# Patient Record
Sex: Female | Born: 1975 | Race: White | Hispanic: No | Marital: Married | Smoking: Former smoker
Health system: Southern US, Community
[De-identification: ages and names within clinical notes are randomized; demographics above are authoritative.]

## PROBLEM LIST (undated history)

## (undated) DIAGNOSIS — G514 Facial myokymia: Secondary | ICD-10-CM

## (undated) DIAGNOSIS — M2241 Chondromalacia patellae, right knee: Secondary | ICD-10-CM

## (undated) DIAGNOSIS — S83519A Sprain of anterior cruciate ligament of unspecified knee, initial encounter: Secondary | ICD-10-CM

## (undated) DIAGNOSIS — Z98811 Dental restoration status: Secondary | ICD-10-CM

## (undated) HISTORY — DX: Facial myokymia: G51.4

---

## 1999-09-20 ENCOUNTER — Other Ambulatory Visit: Admission: RE | Admit: 1999-09-20 | Discharge: 1999-09-20 | Payer: Self-pay | Admitting: *Deleted

## 2000-06-11 HISTORY — PX: ANTERIOR CRUCIATE LIGAMENT REPAIR: SHX115

## 2000-08-12 ENCOUNTER — Other Ambulatory Visit: Admission: RE | Admit: 2000-08-12 | Discharge: 2000-08-12 | Payer: Self-pay | Admitting: *Deleted

## 2001-09-08 ENCOUNTER — Encounter: Payer: Self-pay | Admitting: *Deleted

## 2001-09-08 ENCOUNTER — Ambulatory Visit (HOSPITAL_COMMUNITY): Admission: RE | Admit: 2001-09-08 | Discharge: 2001-09-08 | Payer: Self-pay | Admitting: *Deleted

## 2001-10-06 ENCOUNTER — Other Ambulatory Visit: Admission: RE | Admit: 2001-10-06 | Discharge: 2001-10-06 | Payer: Self-pay | Admitting: Obstetrics and Gynecology

## 2002-04-01 ENCOUNTER — Inpatient Hospital Stay (HOSPITAL_COMMUNITY): Admission: AD | Admit: 2002-04-01 | Discharge: 2002-04-03 | Payer: Self-pay | Admitting: Obstetrics and Gynecology

## 2002-06-18 ENCOUNTER — Other Ambulatory Visit: Admission: RE | Admit: 2002-06-18 | Discharge: 2002-06-18 | Payer: Self-pay | Admitting: Obstetrics and Gynecology

## 2003-08-25 ENCOUNTER — Other Ambulatory Visit: Admission: RE | Admit: 2003-08-25 | Discharge: 2003-08-25 | Payer: Self-pay | Admitting: Obstetrics and Gynecology

## 2004-02-18 ENCOUNTER — Inpatient Hospital Stay (HOSPITAL_COMMUNITY): Admission: AD | Admit: 2004-02-18 | Discharge: 2004-02-21 | Payer: Self-pay | Admitting: Obstetrics & Gynecology

## 2004-02-22 ENCOUNTER — Inpatient Hospital Stay (HOSPITAL_COMMUNITY): Admission: AD | Admit: 2004-02-22 | Discharge: 2004-02-24 | Payer: Self-pay | Admitting: Obstetrics and Gynecology

## 2004-04-20 ENCOUNTER — Other Ambulatory Visit: Admission: RE | Admit: 2004-04-20 | Discharge: 2004-04-20 | Payer: Self-pay | Admitting: Obstetrics and Gynecology

## 2004-10-09 ENCOUNTER — Other Ambulatory Visit: Admission: RE | Admit: 2004-10-09 | Discharge: 2004-10-09 | Payer: Self-pay | Admitting: Obstetrics and Gynecology

## 2005-03-09 ENCOUNTER — Other Ambulatory Visit: Admission: RE | Admit: 2005-03-09 | Discharge: 2005-03-09 | Payer: Self-pay | Admitting: Obstetrics and Gynecology

## 2013-01-02 ENCOUNTER — Other Ambulatory Visit: Payer: Self-pay | Admitting: Orthopaedic Surgery

## 2013-01-09 DIAGNOSIS — M2241 Chondromalacia patellae, right knee: Secondary | ICD-10-CM

## 2013-01-09 DIAGNOSIS — S83519A Sprain of anterior cruciate ligament of unspecified knee, initial encounter: Secondary | ICD-10-CM

## 2013-01-09 HISTORY — DX: Chondromalacia patellae, right knee: M22.41

## 2013-01-09 HISTORY — DX: Sprain of anterior cruciate ligament of unspecified knee, initial encounter: S83.519A

## 2013-01-13 ENCOUNTER — Encounter (HOSPITAL_BASED_OUTPATIENT_CLINIC_OR_DEPARTMENT_OTHER): Payer: Self-pay | Admitting: *Deleted

## 2013-01-18 NOTE — H&P (Signed)
Gabriela Johnson is an 37 y.o. female.   Chief Complaint: Right knee pain HPI: Patient is complaining of right knee pain and instability.  She thought she was going to step on a snake and twisted her knee and fell.  This happened a few weeks ago.  Since that time she has had increasing pain in her knee and instability.  A recent MRI scan dated December 02, 2012 shows a complete ACL tear.  She has had the left knee ACL reconstructed many years ago.  We have discussed proceeding with ACL reconstruction on the right knee to increase stability.  Past Medical History  Diagnosis Date  . ACL tear 01/2013    right knee  . 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 Dental crowns present     also dental caps    Past Surgical History  Procedure Laterality Date  . Anterior cruciate ligament repair Left 2002  . Cesarean section      History reviewed. No pertinent family history. Social History:  reports that she has never smoked. She has never used smokeless tobacco. She reports that  drinks alcohol. She reports that she does not use illicit drugs.  Allergies: No Known Allergies  No prescriptions prior to admission    No results found for this or any previous visit (from the past 48 hour(s)). No results found.  Review of Systems  Musculoskeletal: Positive for back pain.  All other systems reviewed and are negative.    Height 5\' 3"  (1.6 m), weight 56.7 kg (125 lb), last menstrual period 12/31/2012. Physical Exam  Constitutional: She is oriented to person, place, and time. She appears well-developed.  HENT:  Head: Normocephalic.  Eyes: Pupils are equal, round, and reactive to light.  Neck: Normal range of motion.  Cardiovascular: Normal rate.   Respiratory: Effort normal.  GI: Soft.  Musculoskeletal:  Right knee exam: Trace effusion.  Some pain along the medial joint line to palpation.  Lockman test is loose.  Stability medial and lateral.  Drawer test positive as well.   Neurological: She is oriented to person, place, and time.  Skin: Skin is warm.     Assessment/Plan Assessment: Right knee ACL tear. Plan: We have discussed proceeding with right knee ACL reconstruction to improve stability and function.  We have discussed the risks of anesthesia, infection and DVT associated with this procedure.  We have also stressed the rehabilitation process necessary as well.  Oline Belk R 01/18/2013, 1:27 PM

## 2013-01-20 ENCOUNTER — Encounter (HOSPITAL_BASED_OUTPATIENT_CLINIC_OR_DEPARTMENT_OTHER)
Admission: RE | Disposition: A | Discharge status: Home / Self Care | Payer: Self-pay | Source: Ambulatory Visit | Attending: Orthopaedic Surgery

## 2013-01-20 ENCOUNTER — Ambulatory Visit (HOSPITAL_BASED_OUTPATIENT_CLINIC_OR_DEPARTMENT_OTHER): Payer: BC Managed Care – PPO | Admitting: *Deleted

## 2013-01-20 ENCOUNTER — Encounter (HOSPITAL_BASED_OUTPATIENT_CLINIC_OR_DEPARTMENT_OTHER): Payer: Self-pay | Admitting: *Deleted

## 2013-01-20 ENCOUNTER — Ambulatory Visit (HOSPITAL_BASED_OUTPATIENT_CLINIC_OR_DEPARTMENT_OTHER)
Admission: RE | Admit: 2013-01-20 | Discharge: 2013-01-20 | Disposition: A | Discharge status: Home / Self Care | Payer: BC Managed Care – PPO | Source: Ambulatory Visit | Attending: Orthopaedic Surgery | Admitting: Orthopaedic Surgery

## 2013-01-20 DIAGNOSIS — Y929 Unspecified place or not applicable: Secondary | ICD-10-CM | POA: Insufficient documentation

## 2013-01-20 DIAGNOSIS — W010XXA Fall on same level from slipping, tripping and stumbling without subsequent striking against object, initial encounter: Secondary | ICD-10-CM | POA: Insufficient documentation

## 2013-01-20 DIAGNOSIS — M2240 Chondromalacia patellae, unspecified knee: Secondary | ICD-10-CM | POA: Insufficient documentation

## 2013-01-20 DIAGNOSIS — S83509A Sprain of unspecified cruciate ligament of unspecified knee, initial encounter: Secondary | ICD-10-CM | POA: Insufficient documentation

## 2013-01-20 DIAGNOSIS — S83511A Sprain of anterior cruciate ligament of right knee, initial encounter: Secondary | ICD-10-CM

## 2013-01-20 HISTORY — PX: CHONDROPLASTY: SHX5177

## 2013-01-20 HISTORY — DX: Dental restoration status: Z98.811

## 2013-01-20 HISTORY — PX: KNEE ARTHROSCOPY WITH ANTERIOR CRUCIATE LIGAMENT (ACL) REPAIR WITH HAMSTRING GRAFT: SHX5645

## 2013-01-20 HISTORY — DX: Sprain of anterior cruciate ligament of unspecified knee, initial encounter: S83.519A

## 2013-01-20 HISTORY — DX: Chondromalacia patellae, right knee: M22.41

## 2013-01-20 SURGERY — KNEE ARTHROSCOPY WITH ANTERIOR CRUCIATE LIGAMENT (ACL) REPAIR WITH HAMSTRING GRAFT
Anesthesia: General | Site: Knee | Laterality: Right | Wound class: Clean

## 2013-01-20 MED ORDER — OXYCODONE HCL 5 MG/5ML PO SOLN: 5.0000 mg | Freq: Once | ORAL | Status: DC | PRN

## 2013-01-20 MED ORDER — CEFAZOLIN SODIUM-DEXTROSE 2-3 GM-% IV SOLR
2.0000 g | INTRAVENOUS | Status: AC
  Administered 2013-01-20: 2 g via INTRAVENOUS

## 2013-01-20 MED ORDER — ONDANSETRON HCL 4 MG/2ML IJ SOLN
INTRAMUSCULAR | Status: DC | PRN
  Administered 2013-01-20: 4 mg via INTRAVENOUS

## 2013-01-20 MED ORDER — MIDAZOLAM HCL 5 MG/5ML IJ SOLN
INTRAMUSCULAR | Status: DC | PRN
  Administered 2013-01-20: 2 mg via INTRAVENOUS

## 2013-01-20 MED ORDER — FENTANYL CITRATE 0.05 MG/ML IJ SOLN
INTRAMUSCULAR | Status: DC | PRN
  Administered 2013-01-20 (×4): 25 ug via INTRAVENOUS

## 2013-01-20 MED ORDER — MIDAZOLAM HCL 2 MG/2ML IJ SOLN: 1.0000 mg | INTRAMUSCULAR | Status: DC | PRN

## 2013-01-20 MED ORDER — DEXAMETHASONE SODIUM PHOSPHATE 4 MG/ML IJ SOLN
INTRAMUSCULAR | Status: DC | PRN
  Administered 2013-01-20: 10 mg via INTRAVENOUS

## 2013-01-20 MED ORDER — ONDANSETRON HCL 4 MG/2ML IJ SOLN: 4.0000 mg | Freq: Four times a day (QID) | INTRAMUSCULAR | Status: DC | PRN

## 2013-01-20 MED ORDER — MIDAZOLAM HCL 2 MG/ML PO SYRP: 12.0000 mg | ORAL_SOLUTION | Freq: Once | ORAL | Status: DC | PRN

## 2013-01-20 MED ORDER — MIDAZOLAM HCL 2 MG/2ML IJ SOLN
1.0000 mg | INTRAMUSCULAR | Status: DC | PRN
  Administered 2013-01-20: 2 mg via INTRAVENOUS

## 2013-01-20 MED ORDER — BUPIVACAINE-EPINEPHRINE PF 0.5-1:200000 % IJ SOLN
INTRAMUSCULAR | Status: DC | PRN
  Administered 2013-01-20: 30 mL

## 2013-01-20 MED ORDER — CHLORHEXIDINE GLUCONATE 4 % EX LIQD: 60.0000 mL | Freq: Once | CUTANEOUS | Status: DC

## 2013-01-20 MED ORDER — LACTATED RINGERS IV SOLN: INTRAVENOUS | Status: DC

## 2013-01-20 MED ORDER — LIDOCAINE HCL (CARDIAC) 20 MG/ML IV SOLN
INTRAVENOUS | Status: DC | PRN
  Administered 2013-01-20: 80 mg via INTRAVENOUS

## 2013-01-20 MED ORDER — FENTANYL CITRATE 0.05 MG/ML IJ SOLN
50.0000 ug | INTRAMUSCULAR | Status: DC | PRN
  Administered 2013-01-20: 100 ug via INTRAVENOUS

## 2013-01-20 MED ORDER — SODIUM CHLORIDE 0.9 % IR SOLN
Status: DC | PRN
  Administered 2013-01-20: 24000 mL

## 2013-01-20 MED ORDER — FENTANYL CITRATE 0.05 MG/ML IJ SOLN: 50.0000 ug | INTRAMUSCULAR | Status: DC | PRN

## 2013-01-20 MED ORDER — HYDROMORPHONE HCL PF 1 MG/ML IJ SOLN
0.2500 mg | INTRAMUSCULAR | Status: DC | PRN
  Administered 2013-01-20 (×3): 0.5 mg via INTRAVENOUS

## 2013-01-20 MED ORDER — OXYCODONE HCL 5 MG PO TABS: 5.0000 mg | ORAL_TABLET | Freq: Once | ORAL | Status: DC | PRN

## 2013-01-20 MED ORDER — OXYCODONE-ACETAMINOPHEN 5-325 MG PO TABS: 2.0000 | ORAL_TABLET | ORAL | Status: DC | PRN

## 2013-01-20 MED ORDER — LACTATED RINGERS IV SOLN
INTRAVENOUS | Status: DC
  Administered 2013-01-20 (×2): via INTRAVENOUS

## 2013-01-20 MED ORDER — PROPOFOL 10 MG/ML IV BOLUS
INTRAVENOUS | Status: DC | PRN
  Administered 2013-01-20: 40 mg via INTRAVENOUS
  Administered 2013-01-20: 160 mg via INTRAVENOUS

## 2013-01-20 SURGICAL SUPPLY — 74 items
APL SKNCLS STERI-STRIP NONHPOA (GAUZE/BANDAGES/DRESSINGS)
BANDAGE ELASTIC 6 VELCRO ST LF (GAUZE/BANDAGES/DRESSINGS) ×2 IMPLANT
BANDAGE ESMARK 6X9 LF (GAUZE/BANDAGES/DRESSINGS) IMPLANT
BENZOIN TINCTURE PRP APPL 2/3 (GAUZE/BANDAGES/DRESSINGS) IMPLANT
BLADE CUDA 5.5 (BLADE) IMPLANT
BLADE GREAT WHITE 4.2 (BLADE) ×2 IMPLANT
BLADE SURG 15 STRL LF DISP TIS (BLADE) ×1 IMPLANT
BLADE SURG 15 STRL SS (BLADE) ×2
BNDG CMPR 9X6 STRL LF SNTH (GAUZE/BANDAGES/DRESSINGS)
BNDG ESMARK 6X9 LF (GAUZE/BANDAGES/DRESSINGS)
BUR VERTEX HOODED 4.5 (BURR) ×2 IMPLANT
CANISTER OMNI JUG 16 LITER (MISCELLANEOUS) ×2 IMPLANT
CANISTER SUCTION 2500CC (MISCELLANEOUS) IMPLANT
COVER TABLE BACK 60X90 (DRAPES) ×2 IMPLANT
CUFF TOURNIQUET SINGLE 34IN LL (TOURNIQUET CUFF) ×2 IMPLANT
DECANTER SPIKE VIAL GLASS SM (MISCELLANEOUS) IMPLANT
DRAPE ARTHROSCOPY W/POUCH 114 (DRAPES) ×2 IMPLANT
DRAPE INCISE IOBAN 66X45 STRL (DRAPES) IMPLANT
DRAPE U-SHAPE 47X51 STRL (DRAPES) ×2 IMPLANT
DRSG EMULSION OIL 3X3 NADH (GAUZE/BANDAGES/DRESSINGS) ×2 IMPLANT
DURAPREP 26ML APPLICATOR (WOUND CARE) ×2 IMPLANT
ELECT MENISCUS 165MM 90D (ELECTRODE) IMPLANT
ELECT REM PT RETURN 9FT ADLT (ELECTROSURGICAL) ×2
ELECTRODE REM PT RTRN 9FT ADLT (ELECTROSURGICAL) ×1 IMPLANT
FIBERSTICK 2 (SUTURE) IMPLANT
GAUZE SPONGE 4X4 16PLY XRAY LF (GAUZE/BANDAGES/DRESSINGS) IMPLANT
GLOVE BIO SURGEON STRL SZ 6.5 (GLOVE) ×2 IMPLANT
GLOVE BIO SURGEON STRL SZ8.5 (GLOVE) ×2 IMPLANT
GLOVE BIOGEL PI IND STRL 7.0 (GLOVE) ×1 IMPLANT
GLOVE BIOGEL PI IND STRL 8 (GLOVE) ×1 IMPLANT
GLOVE BIOGEL PI IND STRL 8.5 (GLOVE) ×1 IMPLANT
GLOVE BIOGEL PI INDICATOR 7.0 (GLOVE) ×1
GLOVE BIOGEL PI INDICATOR 8 (GLOVE) ×1
GLOVE BIOGEL PI INDICATOR 8.5 (GLOVE) ×1
GLOVE SS BIOGEL STRL SZ 8 (GLOVE) ×1 IMPLANT
GLOVE SUPERSENSE BIOGEL SZ 8 (GLOVE) ×1
GOWN PREVENTION PLUS XLARGE (GOWN DISPOSABLE) ×2 IMPLANT
GOWN PREVENTION PLUS XXLARGE (GOWN DISPOSABLE) ×2 IMPLANT
IMMOBILIZER KNEE 22 UNIV (SOFTGOODS) ×2 IMPLANT
IMMOBILIZER KNEE 24 THIGH 36 (MISCELLANEOUS) IMPLANT
IMMOBILIZER KNEE 24 UNIV (MISCELLANEOUS)
IV NS IRRIG 3000ML ARTHROMATIC (IV SOLUTION) ×18 IMPLANT
KIT TRANSTIBIAL (DISPOSABLE) IMPLANT
KNEE WRAP E Z 3 GEL PACK (MISCELLANEOUS) ×2 IMPLANT
NS IRRIG 1000ML POUR BTL (IV SOLUTION) ×2 IMPLANT
PACK ARTHROSCOPY DSU (CUSTOM PROCEDURE TRAY) ×2 IMPLANT
PACK BASIN DAY SURGERY FS (CUSTOM PROCEDURE TRAY) ×2 IMPLANT
PATELLA LIGAMENT BISECTED FR (Tissue) ×2 IMPLANT
PENCIL BUTTON HOLSTER BLD 10FT (ELECTRODE) IMPLANT
SCREW PROPEL 8X20 (Screw) ×4 IMPLANT
SET ARTHROSCOPY TUBING (MISCELLANEOUS) ×2
SET ARTHROSCOPY TUBING LN (MISCELLANEOUS) ×1 IMPLANT
SHEET MEDIUM DRAPE 40X70 STRL (DRAPES) ×2 IMPLANT
SLEEVE SCD COMPRESS KNEE MED (MISCELLANEOUS) ×2 IMPLANT
SPONGE GAUZE 4X4 12PLY (GAUZE/BANDAGES/DRESSINGS) ×2 IMPLANT
SPONGE LAP 4X18 X RAY DECT (DISPOSABLE) IMPLANT
STRIP CLOSURE SKIN 1/2X4 (GAUZE/BANDAGES/DRESSINGS) IMPLANT
SUCTION FRAZIER TIP 10 FR DISP (SUCTIONS) IMPLANT
SUT 2 FIBERLOOP 20 STRT BLUE (SUTURE) ×4
SUT ETHILON 4 0 PS 2 18 (SUTURE) IMPLANT
SUT PDS AB 1 CT  36 (SUTURE)
SUT PDS AB 1 CT 36 (SUTURE) IMPLANT
SUT STEEL 5 (SUTURE) ×2 IMPLANT
SUT VIC AB 0 CT1 27 (SUTURE)
SUT VIC AB 0 CT1 27XBRD ANBCTR (SUTURE) IMPLANT
SUT VIC AB 2-0 SH 27 (SUTURE) ×4
SUT VIC AB 2-0 SH 27XBRD (SUTURE) ×2 IMPLANT
SUT VIC AB 3-0 FS2 27 (SUTURE) IMPLANT
SUTURE 2 FIBERLOOP 20 STRT BLU (SUTURE) ×2 IMPLANT
SYR 3ML 18GX1 1/2 (SYRINGE) IMPLANT
TOWEL OR 17X24 6PK STRL BLUE (TOWEL DISPOSABLE) ×2 IMPLANT
TOWEL OR NON WOVEN STRL DISP B (DISPOSABLE) ×2 IMPLANT
WAND 30 DEG SABER W/CORD (SURGICAL WAND) IMPLANT
WATER STERILE IRR 1000ML POUR (IV SOLUTION) ×2 IMPLANT

## 2013-01-20 NOTE — Transfer of Care (Signed)
Immediate Anesthesia Transfer of Care Note  Patient: Gabriela Johnson  Procedure(s) Performed: Procedure(s): RIGHT KNEE ARTHROSCOPY WITH ANTERIOR CRUCIATE LIGAMENT (ACL) RECONSTRUCTION WITH ALLOGRAFT FROM PATELLA TENDON (Right) CHONDROPLASTY RIGHT KNEE (Right)  Patient Location: PACU  Anesthesia Type:GA combined with regional for post-op pain  Level of Consciousness: awake, sedated and patient cooperative  Airway & Oxygen Therapy: Patient Spontanous Breathing and Patient connected to face mask oxygen  Post-op Assessment: Report given to PACU RN and Post -op Vital signs reviewed and stable  Post vital signs: Reviewed and stable  Complications: No apparent anesthesia complications

## 2013-01-20 NOTE — Anesthesia Preprocedure Evaluation (Signed)
Anesthesia Evaluation  Patient identified by MRN, date of birth, ID band Patient awake    Reviewed: Allergy & Precautions, H&P , NPO status , Patient's Chart, lab work & pertinent test results  Airway Mallampati: II  Neck ROM: full    Dental   Pulmonary          Cardiovascular     Neuro/Psych    GI/Hepatic   Endo/Other    Renal/GU      Musculoskeletal   Abdominal   Peds  Hematology   Anesthesia Other Findings   Reproductive/Obstetrics                           Anesthesia Physical Anesthesia Plan  ASA: I  Anesthesia Plan: General and Regional   Post-op Pain Management: MAC Combined w/ Regional for Post-op pain   Induction: Intravenous  Airway Management Planned: LMA  Additional Equipment:   Intra-op Plan:   Post-operative Plan:   Informed Consent: I have reviewed the patients History and Physical, chart, labs and discussed the procedure including the risks, benefits and alternatives for the proposed anesthesia with the patient or authorized representative who has indicated his/her understanding and acceptance.     Plan Discussed with: CRNA, Anesthesiologist and Surgeon  Anesthesia Plan Comments:         Anesthesia Quick Evaluation

## 2013-01-20 NOTE — Anesthesia Procedure Notes (Addendum)
Procedure Name: LMA Insertion Date/Time: 01/20/2013 1:01 PM Performed by: Gar Gibbon Pre-anesthesia Checklist: Patient identified, Emergency Drugs available, Suction available and Patient being monitored Patient Re-evaluated:Patient Re-evaluated prior to inductionOxygen Delivery Method: Circle System Utilized Preoxygenation: Pre-oxygenation with 100% oxygen Intubation Type: IV induction Ventilation: Mask ventilation without difficulty LMA: LMA inserted LMA Size: 4.0 Number of attempts: 1 Airway Equipment and Method: bite block Placement Confirmation: positive ETCO2 Tube secured with: Tape Dental Injury: Teeth and Oropharynx as per pre-operative assessment    Anesthesia Regional Block:  Femoral nerve block  Pre-Anesthetic Checklist: ,, timeout performed, Correct Patient, Correct Site, Correct Laterality, Correct Procedure,, site marked, risks and benefits discussed, Surgical consent,  Pre-op evaluation,  At surgeon's request and post-op pain management  Laterality: Right  Prep: chloraprep       Needles:  Injection technique: Single-shot  Needle Type: Echogenic Stimulator Needle     Needle Length: 9cm  Needle Gauge: 21    Additional Needles:  Procedures: nerve stimulator Femoral nerve block  Nerve Stimulator or Paresthesia:  Response: Quadriceps muscle contraction, 0.45 mA,   Additional Responses:   Narrative:  Start time: 01/20/2013 12:20 PM End time: 01/20/2013 1:30 PM Injection made incrementally with aspirations every 5 mL.  Performed by: Personally  Anesthesiologist: Dr Chaney Malling  Additional Notes: Functioning IV was confirmed and monitors were applied.  A 90mm 21ga Arrow echogenic stimulator needle was used. Sterile prep and drape,hand hygiene and sterile gloves were used.  Negative aspiration and negative test dose prior to incremental administration of local anesthetic. The patient tolerated the procedure well.    Femoral nerve block

## 2013-01-20 NOTE — Interval H&P Note (Signed)
History and Physical Interval Note:  01/20/2013 12:18 PM  Gabriela Johnson  has presented today for surgery, with the diagnosis of RIGHT KNEE ACL TEAR AND CHONDROMALACIA  The various methods of treatment have been discussed with the patient and family. After consideration of risks, benefits and other options for treatment, the patient has consented to  Procedure(s): RIGHT KNEE ARTHROSCOPY WITH ANTERIOR CRUCIATE LIGAMENT (ACL) RECONSTRUCTION WITH ALLOGRAFT FROM PATELLA TENDON (Right) as a surgical intervention .  The patient's history has been reviewed, patient examined, no change in status, stable for surgery.  I have reviewed the patient's chart and labs.  Questions were answered to the patient's satisfaction.     Denson Niccoli G

## 2013-01-20 NOTE — Op Note (Signed)
PRE-OP DIAGNOSIS:  ACL tear right knee and chondromalacia patella POST-OP DIAGNOSIS:  ACL tear right knee and chondromalacia patella  PROCEDURE:  ACL reconstruction  right knee  and chondroplasty patella SURGEON:  Marcene Corning MD ASSISTANT: Lindwood Qua PA ANESTHESIA:  General and block  INDICATION FOR PROCEDURE:  Gabriela Johnson is a 37 y.o. female with an unstable knee.  The patient has failed non-operative measures and has a knee that does not allow for participation in desired activities.  The patient is offered ACL reconstruction in hopes of stabilizing the knee.  Associated conditions are to be addressed as well.  Informed operative consent was obtained after discussion of risks including reaction to anesthesia, infection, DVT, and stiffness.  The importance of the post-operative rehabilitation protocol to optimize result was stressed extensively with the patient.  SUMMARY OF FINDINGS AND PROCEDURE:  Gabriela Johnson was taken to the operative suite where under the above anesthesia a knee arthroscopy and ACL reconstruction was performed. The suprapatellar pouch was benign while the patellofemoral joint showed a small area of grade III articular cartilage damage.  The medial compartment was notable for no articular cartilage damage and no meniscal pathology.  The ACL was torn and the PCL was intact.  The lateral compartment was notable for no articular cartilage damage and no meniscal pathology.  The meniscal and articular cartilage problems were addressed with chondroplasty patella. We used patellar tendon allograft material and stabilized at both ends with metal Linvatec screws.   Silvio Pate PA assisted throughout and was invaluable to the completion of the case in that he positioned and retracted and also fashioned the graft on the back table while I performed arthroscopic portions of the case thereby significantly minimizing OR time.  The patient was scheduled to stay overnight at  but might go home depending on condition in the recovery room.  DESCRIPTION OF PROCEDURE:  Gabriela Johnson was taken to the operative suite where the above anesthetic was applied.  The patient was positioned supine and prepped and draped in normal sterile fashion.  An appropriate time out was performed.  After the administration of kefzol pre-operative antibiotic and arthroscopy of the knee was performed. Findings were as noted above and appropriate articular and meniscal cartilage work was done.  The ACL reconstruction was then performed utilizing the above mentioned material.  We harvested the middle third of the patellar tendon through a longitudinal incision and dissection through peritenon.  A saw was used to create contiguous bone plugs from the tibial tubercle and patella. A conservative notch-plasty was done with a burr.  A tourniquet was not utilized.  We prepared the aforementioned graft with saw and drill to fit through planned tunnels and bone plugs were fashioned to be one mm smaller than tunnels.  A guide was placed in the knee anterior to the PCL near the ACL footprint and utilized to place a guide wire up into the knee.  Over this I reamed to a diameter of 11 mm.  A second guide was placed through the medial portal low on the femur at the ACL footprint there and utilized for placement of a guide pin through the femur and out the lateral thigh.  Over this I reamed a femoral tunnel to a diameter of 9.5 mm and depth of 2 cm.  Bony debris was removed from the knee with the shaver.  The aforementioned graft was pulled through the tibial tunnel into the femoral tunnel with care taken to keep the  tendinous portion of the graft in an anterior position as it entered the femoral tunnel.  I placed a guidewire anterior in the femoral tunnel and over this placed a 8 by 20 mm interference screw.  The knee was ranged and the graft was felt to be very isometric.  Another guidewire was placed through the tibial  tunnel and seen to enter the knee arthroscopically.  Over this I placed another interference screw which was  8 by 20 mm in size.  The knee was again ranged and easily came to full extension with no impingement.  Arthroscopic equipment was removed at this point.  In case of patellar tendon autograft peritenon was closed with #0 vicryl followed by subcutaneous re-approximation in both allograft and autograft cases using 2-0 undyed vicryl and skin closure with nylon.  Adaptic was applied along with a sterile dressing.  Estimated blood loss and intraoperative fluids can be obtained from anesthesia records.  DISPOSITION:  The patient was extubated in the operating room and taken to recovery room in stable condition.  Plans were to stay overnight though the patient might be able to go home same day depending on condition in recovery.    Gabriela Johnson G 01/20/2013, 2:42 PM

## 2013-01-20 NOTE — Anesthesia Postprocedure Evaluation (Signed)
Anesthesia Post Note  Patient: Khristina L Asa  Procedure(s) Performed: Procedure(s) (LRB): RIGHT KNEE ARTHROSCOPY WITH ANTERIOR CRUCIATE LIGAMENT (ACL) RECONSTRUCTION WITH ALLOGRAFT FROM PATELLA TENDON (Right) CHONDROPLASTY RIGHT KNEE (Right)  Anesthesia type: General  Patient location: PACU  Post pain: Pain level controlled and Adequate analgesia  Post assessment: Post-op Vital signs reviewed, Patient's Cardiovascular Status Stable, Respiratory Function Stable, Patent Airway and Pain level controlled  Last Vitals:  Filed Vitals:   01/20/13 1545  BP: 122/79  Pulse: 86  Temp:   Resp: 15    Post vital signs: Reviewed and stable  Level of consciousness: awake, alert  and oriented  Complications: No apparent anesthesia complications

## 2013-01-20 NOTE — Progress Notes (Signed)
Assisted Dr. Hodierne with right, ultrasound guided, femoral block. Side rails up, monitors on throughout procedure. See vital signs in flow sheet. Tolerated Procedure well. 

## 2013-01-21 ENCOUNTER — Encounter (HOSPITAL_BASED_OUTPATIENT_CLINIC_OR_DEPARTMENT_OTHER): Payer: Self-pay | Admitting: Orthopaedic Surgery

## 2013-12-07 ENCOUNTER — Other Ambulatory Visit: Payer: Self-pay | Admitting: Obstetrics and Gynecology

## 2013-12-07 DIAGNOSIS — R928 Other abnormal and inconclusive findings on diagnostic imaging of breast: Secondary | ICD-10-CM

## 2013-12-16 ENCOUNTER — Ambulatory Visit
Admission: RE | Admit: 2013-12-16 | Discharge: 2013-12-16 | Disposition: A | Discharge status: Home / Self Care | Payer: BC Managed Care – PPO | Source: Ambulatory Visit | Attending: Obstetrics and Gynecology | Admitting: Obstetrics and Gynecology

## 2013-12-16 DIAGNOSIS — R928 Other abnormal and inconclusive findings on diagnostic imaging of breast: Secondary | ICD-10-CM

## 2015-06-22 ENCOUNTER — Other Ambulatory Visit: Payer: Self-pay | Admitting: Orthopaedic Surgery

## 2015-06-22 DIAGNOSIS — M25562 Pain in left knee: Secondary | ICD-10-CM

## 2015-06-26 ENCOUNTER — Ambulatory Visit
Admission: RE | Admit: 2015-06-26 | Discharge: 2015-06-26 | Disposition: A | Discharge status: Home / Self Care | Payer: BLUE CROSS/BLUE SHIELD | Source: Ambulatory Visit | Attending: Orthopaedic Surgery | Admitting: Orthopaedic Surgery

## 2015-06-26 DIAGNOSIS — M25562 Pain in left knee: Secondary | ICD-10-CM

## 2016-11-02 IMAGING — MR MR KNEE*L* W/O CM
4 of 6 series · 27 of 40 positions shown · non-contrast
Comparison: None.

CLINICAL DATA: Lateral knee pain after injury on trampoline
approximately 3 weeks ago. History of ACL reconstruction in 7337.

EXAM:
MRI OF THE LEFT KNEE WITHOUT CONTRAST
TECHNIQUE: Multiplanar, multisequence MR imaging of the knee was performed. No
intravenous contrast was administered.

[Series 4: PD fat-sat · coronal · 4.0mm · 0.53mm/px · 7 of 21 slices shown (1 of 2)]
[im 1/21]
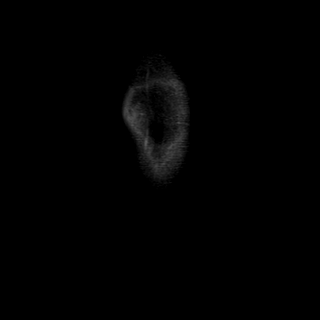
[im 4/21]
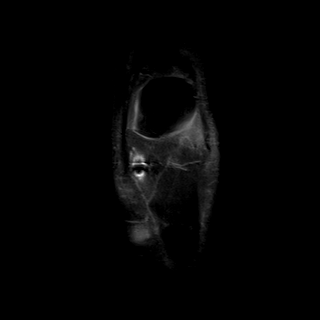
[im 7/21]
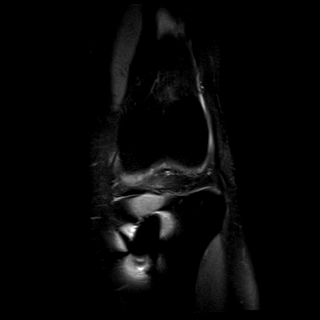
[im 11/21]
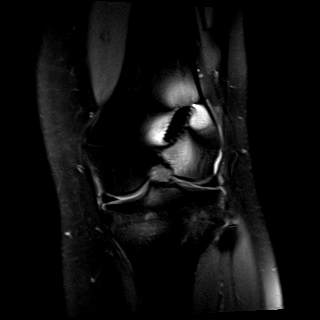
[im 14/21]
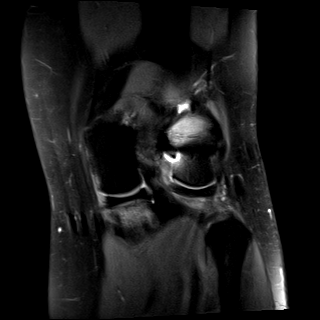
[im 17/21]
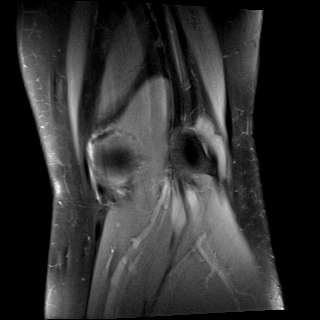
[im 21/21]
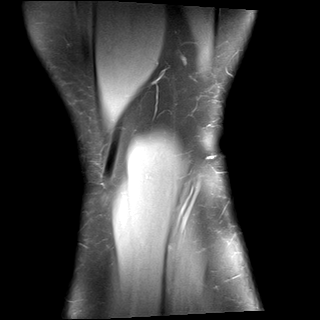

[Series 6: T1 · coronal · 4.0mm · 0.27mm/px · 7 of 21 slices shown]
[im 1/21]
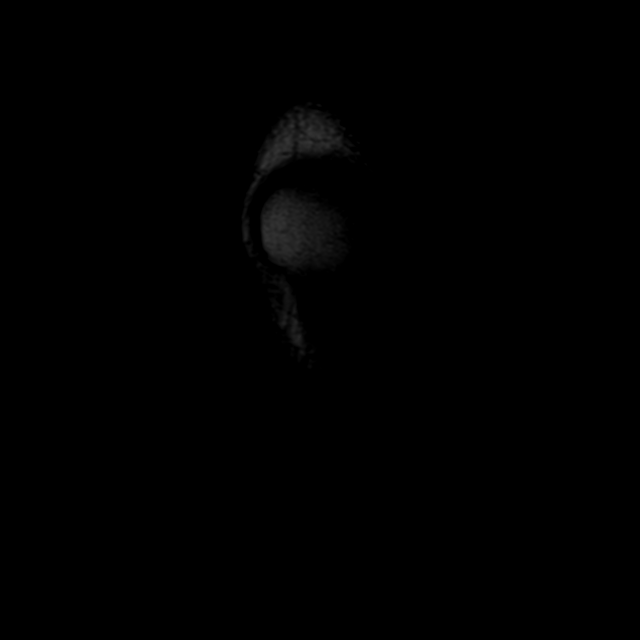
[im 4/21]
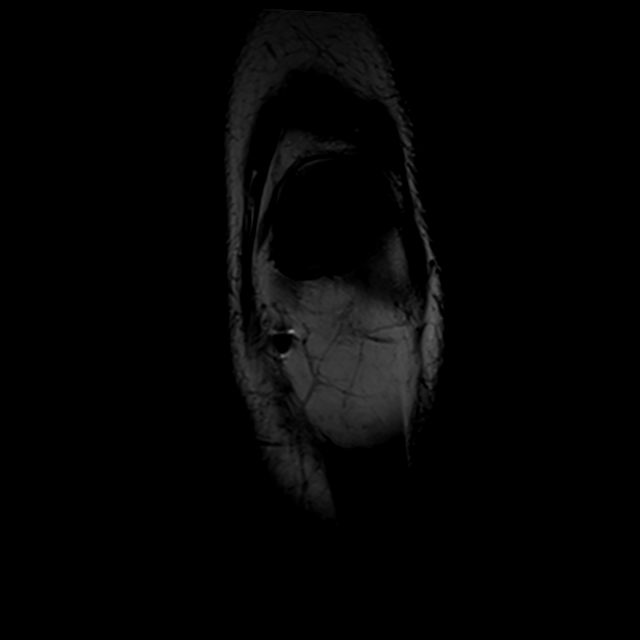
[im 7/21]
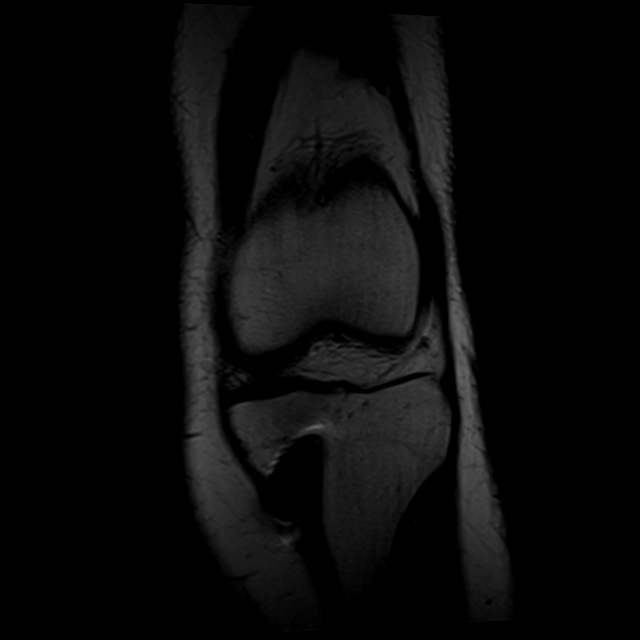
[im 11/21]
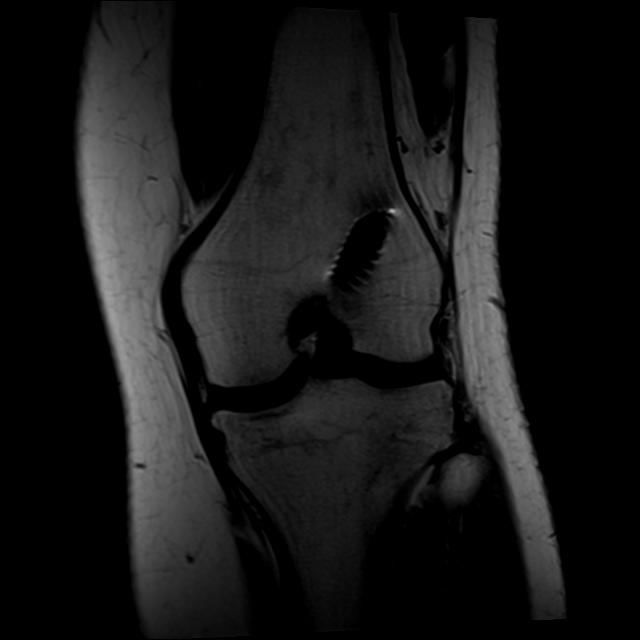
[im 14/21]
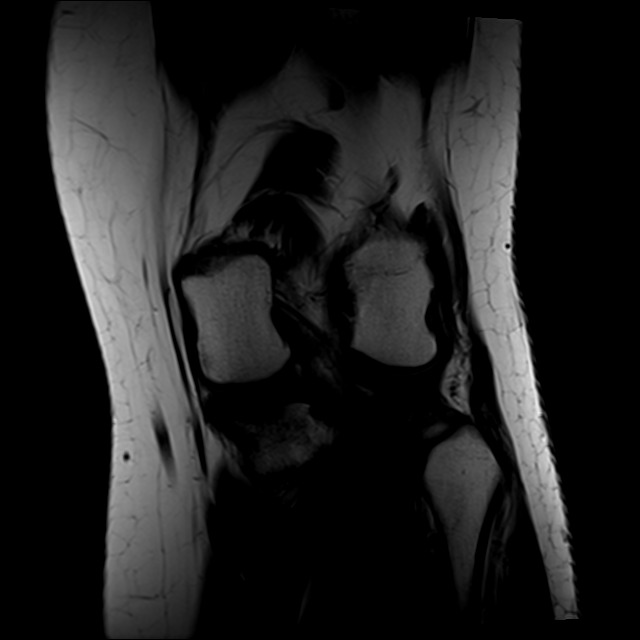
[im 17/21]
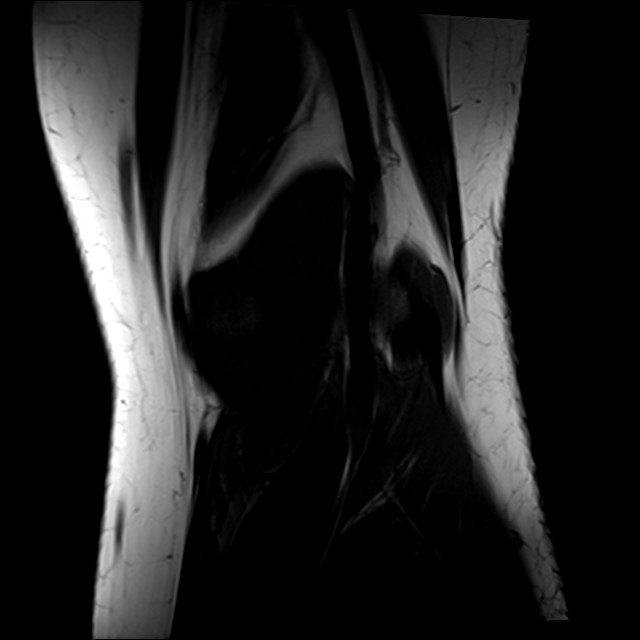
[im 21/21]
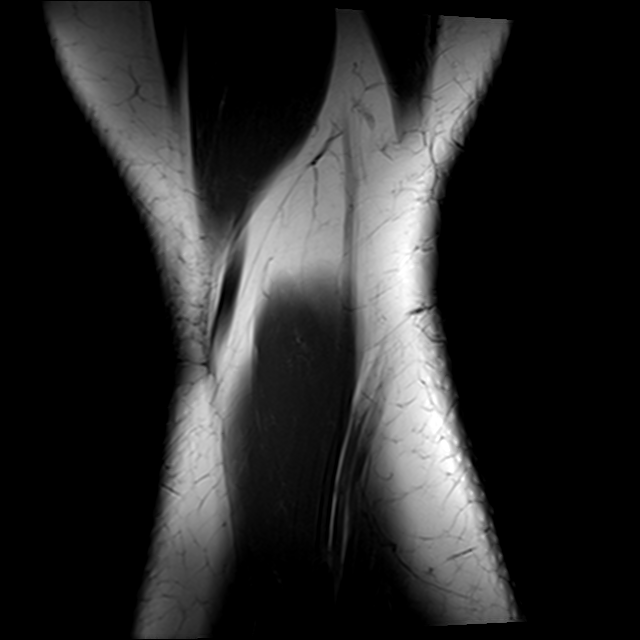

[Series 7: PD fat-sat · sagittal · 4.0mm · 0.47mm/px · 6 of 18 slices shown (2 of 2)]
[im 1/18]
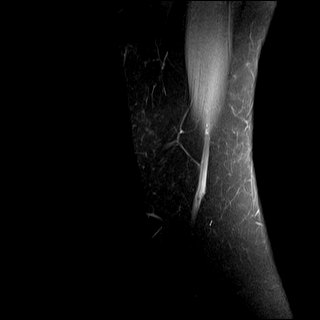
[im 4/18]
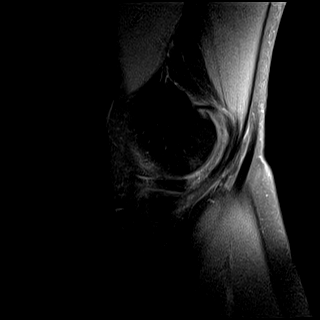
[im 7/18]
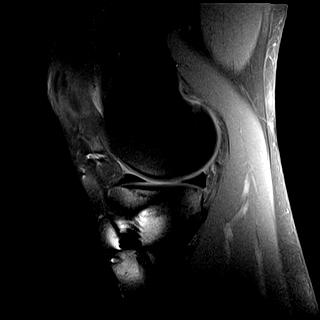
[im 11/18]
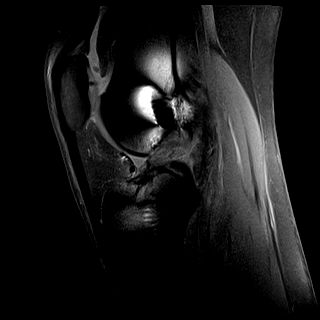
[im 14/18]
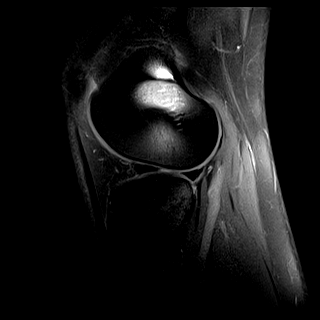
[im 18/18]
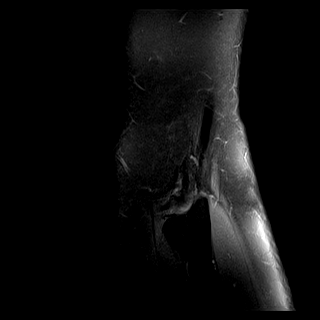

[Series 9: T2 fat-sat · coronal · 4.0mm · 0.53mm/px · 7 of 21 slices shown]
[im 1/21]
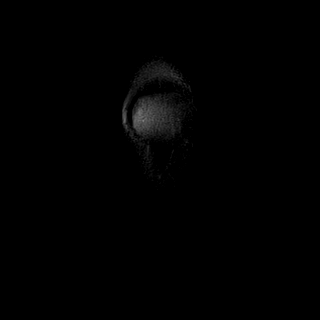
[im 4/21]
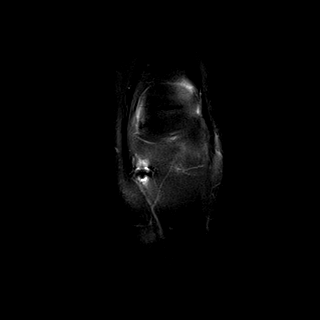
[im 7/21]
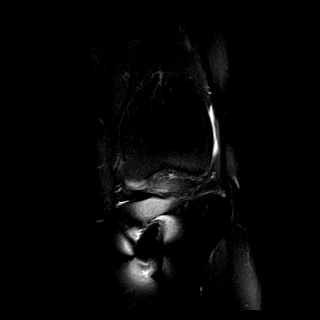
[im 11/21]
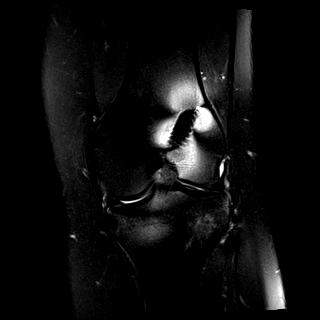
[im 14/21]
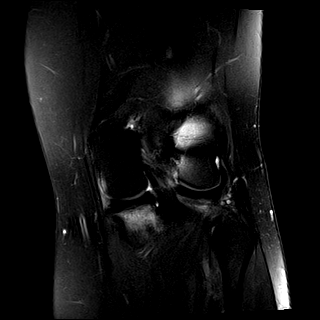
[im 17/21]
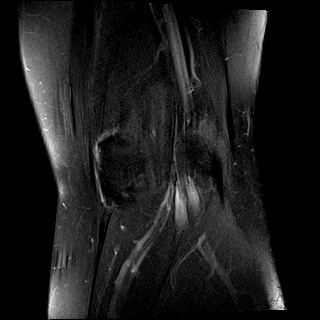
[im 21/21]
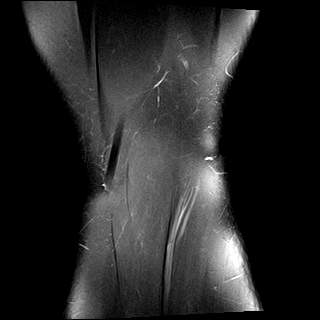

[27 of 40 positions shown; findings below may reference images not displayed]

FINDINGS: MENISCI

Medial meniscus:  Intact with normal morphology.

Lateral meniscus:  Intact with normal morphology.

LIGAMENTS

Cruciates: Status post ACL reconstruction. Interference screws
appear well positioned. The graft is poorly visualized on the
sagittal images and is likely torn. There may be a few intact fibers
based on the coronal and axial images. The PCL is intact. There is
an anterior drawer sign measuring approximately 13 mm.

Collaterals:  Intact.

CARTILAGE

Patellofemoral:  Preserved.

Medial:  Preserved.

Lateral:  Preserved.

OTHER

Joint:  No significant joint effusion.

Popliteal Fossa:  Unremarkable. No significant Baker's cyst.

Extensor Mechanism: Intact. There is postsurgical susceptibility
artifact medially in Hoffa's fat. There are no postsurgical changes
in the patellar tendon.

Bones: There are bone contusions of both tibial plateaus
posteriorly. No depression of the articular surface identified. No
corresponding femoral bone contusion identified.
IMPRESSION: 1. Injury of the ACL graft. The graft is likely completely torn,
supported by the secondary findings of an anterior drawer sign and
bone contusions of both tibial plateaus posteriorly.
2. The menisci, collateral ligaments and PCL are intact.
3. Contusions of both tibial plateaus posteriorly.

## 2017-11-19 ENCOUNTER — Other Ambulatory Visit: Payer: Self-pay | Admitting: Obstetrics and Gynecology

## 2019-03-09 ENCOUNTER — Other Ambulatory Visit: Payer: Self-pay

## 2019-03-09 ENCOUNTER — Encounter: Payer: Self-pay | Admitting: *Deleted

## 2019-03-09 ENCOUNTER — Encounter: Payer: Self-pay | Admitting: Neurology

## 2019-03-09 ENCOUNTER — Ambulatory Visit: Payer: BC Managed Care – PPO | Admitting: Neurology

## 2019-03-09 VITALS — BP 117/77 | HR 74 | Temp 98.3°F | Ht 63.0 in | Wt 138.5 lb

## 2019-03-09 DIAGNOSIS — R9089 Other abnormal findings on diagnostic imaging of central nervous system: Secondary | ICD-10-CM

## 2019-03-09 DIAGNOSIS — G5132 Clonic hemifacial spasm, left: Secondary | ICD-10-CM | POA: Diagnosis not present

## 2019-03-09 NOTE — Progress Notes (Signed)
PATIENT: Gabriela Johnson DOB: Jul 10, 1975  Chief Complaint  Patient presents with  . Facial Twitching    She is here for left-sided facial twitching.  Symptoms present for last year.  Recent brain MRI (at [facility]) showed no identifiable cause.  She was offered gabapentin 100mg  BID but never tried the medication.   . Neurology    , MD - referring MD (for second opinion).  No PCP.      HISTORICAL  Gabriela Johnson is of 43 year old female, seen in request by outside neurologist Dr. 55 for evaluation of left facial twitching, initial evaluation was on March 09, 2019.  I have reviewed and summarized the referring note from the referring physician.  She was previously healthy, began to noticed frequent left eye twitching around 2019, gradually getting worse, sometimes feel pulling of her left lower face, it has gradually getting worse over the past 1 year.  She denies visual loss, difficulty talking, for that reason, she was seen by outside neurologist, had MRI of brain, was offered gabapentin, 100 mg twice a day, but she has not taken it yet  We personally reviewed MRI of the brain without contrast from [facility] on January 27, 2019, several scattered small nonenhancing T2/hyperintense signal at bilateral cerebral white matter, predominantly in the frontal lobe, and subcortical region, likely small vessel disease She has occasionally headache in the past, couple times each month, mild to moderate, occasionally with light noise sensitivity, nauseous  REVIEW OF SYSTEMS: Full 14 system review of systems performed and notable only for as above All other review of systems were negative.  ALLERGIES: No Known Allergies  HOME MEDICATIONS: Current Outpatient Medications  Medication Sig Dispense Refill  . Multiple Vitamin (MULTIVITAMIN) tablet Take 1 tablet by mouth daily.     No current facility-administered medications for this visit.     PAST MEDICAL  HISTORY: Past Medical History:  Diagnosis Date  . ACL tear 01/2013   right knee  . 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 Dental crowns present    also dental caps  . Facial twitching     PAST SURGICAL HISTORY: Past Surgical History:  Procedure Laterality Date  . ABDOMINAL HYSTERECTOMY    . ANTERIOR CRUCIATE LIGAMENT REPAIR Left 2002  . CESAREAN SECTION    . CHONDROPLASTY Right 01/20/2013   Procedure: CHONDROPLASTY RIGHT KNEE;  Surgeon: 03/22/2013, MD;  Location: [facility];  Service: Orthopedics;  Laterality: Right;  . KNEE ARTHROSCOPY WITH ANTERIOR CRUCIATE LIGAMENT (ACL) REPAIR WITH HAMSTRING GRAFT Right 01/20/2013   Procedure: RIGHT KNEE ARTHROSCOPY WITH ANTERIOR CRUCIATE LIGAMENT (ACL) RECONSTRUCTION WITH ALLOGRAFT FROM PATELLA TENDON;  Surgeon: 03/22/2013, MD;  Location: [facility];  Service: Orthopedics;  Laterality: Right;    FAMILY HISTORY: Family History  Problem Relation Age of Onset  . Scoliosis Mother   . Hypertension Father     SOCIAL HISTORY: Social History   Socioeconomic History  . Marital status: Married    Spouse name: Not on file  . Number of children: 2  . Years of education: two years of college  . Highest education level: Not on file  Occupational History  . Occupation: [employer], sport and exercise business  Social Needs  . Financial resource strain: Not on file  . Food insecurity    Worry: Not on file    Inability: Not on file  . Transportation needs    Medical: Not on file  Non-medical: Not on file  Tobacco Use  . Smoking status: Former Research scientist (life sciences)  . Smokeless tobacco: Never Used  . Tobacco comment: stopped 4+ yrs ago - smoked 1ppd x 20 yrs  Substance and Sexual Activity  . Alcohol use: Yes    Comment: social  . Drug use: No  . Sexual activity: Not on file  Lifestyle  . Physical activity    Days per week: Not on file    Minutes per session: Not on file  . Stress: Not on file   Relationships  . Social Herbalist on phone: Not on file    Gets together: Not on file    Attends religious service: Not on file    Active member of club or organization: Not on file    Attends meetings of clubs or organizations: Not on file    Relationship status: Not on file  . Intimate partner violence    Fear of current or ex partner: Not on file    Emotionally abused: Not on file    Physically abused: Not on file    Forced sexual activity: Not on file  Other Topics Concern  . Not on file  Social History Narrative   Lives at home with her family.   Right-handed.   Two cups caffeine daily.     PHYSICAL EXAM   Vitals:   03/09/19 1510  BP: 117/77  Pulse: 74  Temp: 98.3 F (36.8 C)  Weight: 138 lb 8 oz (62.8 kg)  Height: 5\' 3"  (1.6 m)    Not recorded      Body mass index is 24.53 kg/m.  PHYSICAL EXAMNIATION:  Gen: NAD, conversant, well nourised, well groomed                     Cardiovascular: Regular rate rhythm, no peripheral edema, warm, nontender. Eyes: Conjunctivae clear without exudates or hemorrhage Neck: Supple, no carotid bruits. Pulmonary: Clear to auscultation bilaterally   NEUROLOGICAL EXAM:  MENTAL STATUS: Speech:    Speech is normal; fluent and spontaneous with normal comprehension.  Cognition:     Orientation to time, place and person     Normal recent and remote memory     Normal Attention span and concentration     Normal Language, naming, repeating,spontaneous speech     Fund of knowledge   CRANIAL NERVES: CN II: Visual fields are full to confrontation.  Pupils are round equal and briskly reactive to light. CN III, IV, VI: extraocular movement are normal. No ptosis. CN V: Facial sensation is intact to pinprick in all 3 divisions bilaterally. Corneal responses are intact.  CN VII: Frequent left orbicularis oculi muscle twitching, mild involvement of left cheek muscle. CN VIII: Hearing is normal to causal conversation. CN  IX, X: Palate elevates symmetrically. Phonation is normal. CN XI: Head turning and shoulder shrug are intact CN XII: Tongue is midline with normal movements and no atrophy.  MOTOR: There is no pronator drift of out-stretched arms. Muscle bulk and tone are normal. Muscle strength is normal.  REFLEXES: Reflexes are 2+ and symmetric at the biceps, triceps, knees, and ankles. Plantar responses are flexor.  SENSORY: Intact to light touch, pinprick, positional sensation and vibratory sensation are intact in fingers and toes.  COORDINATION: Rapid alternating movements and fine finger movements are intact. There is no dysmetria on finger-to-nose and heel-knee-shin.    GAIT/STANCE: Posture is normal. Gait is steady with normal steps, base, arm swing, and turning.  Heel and toe walking are normal. Tandem gait is normal.  Romberg is absent.   DIAGNOSTIC DATA (LABS, IMAGING, TESTING) - I reviewed patient records, labs, notes, testing and imaging myself where available.   ASSESSMENT AND PLAN  Malavika L Sherlie BanWainscott is a 43 y.o. female   Left hemifacial spasm  MRI of the brain to rule out structural lesion  Laboratory evaluations  Preauthorization for Xeomin 50 units return to clinic in 1 month for injection   Levert FeinsteinYijun Tryton Bodi, M.D. Ph.D.  [facility] [address], [address] [place], [place] [address] Ph: [phone] Fax: [phone]  CC: Referring Provider

## 2019-03-10 ENCOUNTER — Telehealth: Payer: Self-pay | Admitting: Neurology

## 2019-03-10 LAB — CBC WITH DIFFERENTIAL
Basophils Absolute: 0 10*3/uL (ref 0.0–0.2)
Basos: 0 %
EOS (ABSOLUTE): 0.1 10*3/uL (ref 0.0–0.4)
Eos: 2 %
Hematocrit: 42.6 % (ref 34.0–46.6)
Hemoglobin: 14.5 g/dL (ref 11.1–15.9)
Immature Grans (Abs): 0 10*3/uL (ref 0.0–0.1)
Immature Granulocytes: 0 %
Lymphocytes Absolute: 1.2 10*3/uL (ref 0.7–3.1)
Lymphs: 23 %
MCH: 33.4 pg — ABNORMAL HIGH (ref 26.6–33.0)
MCHC: 34 g/dL (ref 31.5–35.7)
MCV: 98 fL — ABNORMAL HIGH (ref 79–97)
Monocytes Absolute: 0.4 10*3/uL (ref 0.1–0.9)
Monocytes: 8 %
Neutrophils Absolute: 3.6 10*3/uL (ref 1.4–7.0)
Neutrophils: 67 %
RBC: 4.34 x10E6/uL (ref 3.77–5.28)
RDW: 10.9 % — ABNORMAL LOW (ref 11.7–15.4)
WBC: 5.3 10*3/uL (ref 3.4–10.8)

## 2019-03-10 LAB — COMPREHENSIVE METABOLIC PANEL
ALT: 19 IU/L (ref 0–32)
AST: 17 IU/L (ref 0–40)
Albumin/Globulin Ratio: 2 (ref 1.2–2.2)
Albumin: 4.6 g/dL (ref 3.8–4.8)
Alkaline Phosphatase: 62 IU/L (ref 39–117)
BUN/Creatinine Ratio: 21 (ref 9–23)
BUN: 18 mg/dL (ref 6–24)
Bilirubin Total: <0.2 mg/dL (ref 0.0–1.2)
CO2: 25 mmol/L (ref 20–29)
Calcium: 9.2 mg/dL (ref 8.7–10.2)
Chloride: 101 mmol/L (ref 96–106)
Creatinine, Ser: 0.84 mg/dL (ref 0.57–1.00)
GFR calc Af Amer: 98 mL/min/{1.73_m2} (ref >59)
GFR calc non Af Amer: 85 mL/min/{1.73_m2} (ref >59)
Globulin, Total: 2.3 g/dL (ref 1.5–4.5)
Glucose: 101 mg/dL — ABNORMAL HIGH (ref 65–99)
Potassium: 4 mmol/L (ref 3.5–5.2)
Sodium: 140 mmol/L (ref 134–144)
Total Protein: 6.9 g/dL (ref 6.0–8.5)

## 2019-03-10 LAB — RPR: RPR Ser Ql: NONREACTIVE

## 2019-03-10 LAB — C-REACTIVE PROTEIN: CRP: 2 mg/L (ref 0–10)

## 2019-03-10 LAB — SEDIMENTATION RATE: Sed Rate: 2 mm/hr (ref 0–32)

## 2019-03-10 LAB — VITAMIN B12: Vitamin B-12: 546 pg/mL (ref 232–1245)

## 2019-03-10 LAB — VITAMIN D 25 HYDROXY (VIT D DEFICIENCY, FRACTURES): Vit D, 25-Hydroxy: 27.6 ng/mL — ABNORMAL LOW (ref 30.0–100.0)

## 2019-03-10 LAB — HIV ANTIBODY (ROUTINE TESTING W REFLEX): HIV Screen 4th Generation wRfx: NONREACTIVE

## 2019-03-10 LAB — ANA W/REFLEX: Anti Nuclear Antibody (ANA): NEGATIVE

## 2019-03-10 NOTE — Telephone Encounter (Signed)
Please call patient, laboratory evaluation showed normal hemoglobin of 14.5, vitamin D level of 27.6, normal B12, ESR, RPR, HIV, ANA, C-reactive protein, CMP,  She has mildly low vitamin D level, she will benefit over-the-counter vitamin D supplement 1000 units daily

## 2019-03-11 NOTE — Telephone Encounter (Signed)
Please call patient, I do not see an urgent need for lumbar puncture.  We are going to repeat MRI of the brain in 1 year, if there are any change imaginewise, or if she experiences any clinical changes.  we may consider lumbar puncture then.

## 2019-03-11 NOTE — Telephone Encounter (Signed)
Left message requesting a return call.

## 2019-03-11 NOTE — Telephone Encounter (Addendum)
I spoke to the patient and she verbalized understanding of her lab results.  She was agreeable to start the recommended vitamin D supplement.  She also wanted to further discuss her abnormal MRI brain results.  She wanted to know if a lumbar puncture is going to be necessary.

## 2019-03-11 NOTE — Telephone Encounter (Signed)
She is aware of Dr. Rhea Belton response below.  She verbalized understanding and is agreeable to the plan.

## 2019-03-31 ENCOUNTER — Telehealth: Payer: Self-pay

## 2019-03-31 NOTE — Telephone Encounter (Signed)
Patient was calling to see if her insurance has approved her botox. Please advise.

## 2019-04-01 MED ORDER — XEOMIN 50 UNITS IM SOLR: 50.0000 [IU] | INTRAMUSCULAR | 3 refills | Status: DC

## 2019-04-01 NOTE — Telephone Encounter (Signed)
I called and scheduled the patient and let her know she is covered. We scheduled apt.   Sharyn Lull, will you send script for Xeomin over to [facility]?

## 2019-04-01 NOTE — Telephone Encounter (Signed)
Prescription sent to the requested pharmacy

## 2019-04-01 NOTE — Addendum Note (Signed)
Addended by: Noberto Retort C on: 04/01/2019 04:10 PM   Modules accepted: Orders

## 2019-04-02 NOTE — Telephone Encounter (Signed)
Noted, thank you. DW  °

## 2019-04-15 NOTE — Telephone Encounter (Signed)
Pharmacy called and made Gabriela Johnson aware they have been trying to reach the patient without success. I called and made the patient aware that the pharmacy was trying to reach her.

## 2019-04-22 ENCOUNTER — Ambulatory Visit: Payer: BC Managed Care – PPO | Admitting: Neurology

## 2019-04-24 ENCOUNTER — Telehealth: Payer: Self-pay | Admitting: Neurology

## 2019-04-24 NOTE — Telephone Encounter (Signed)
Gabriela Johnson from [facility] called stating that on the PA the pharmacy has to be listed on it for the medication to be sent out and also their NPI# which is [id number] Please advise.

## 2019-04-28 NOTE — Telephone Encounter (Signed)
Noted, will transition after initial injection. DW

## 2019-04-29 ENCOUNTER — Ambulatory Visit: Payer: BC Managed Care – PPO | Admitting: Neurology

## 2019-04-29 ENCOUNTER — Encounter: Payer: Self-pay | Admitting: Neurology

## 2019-04-29 ENCOUNTER — Other Ambulatory Visit: Payer: Self-pay

## 2019-04-29 VITALS — BP 136/93 | HR 93 | Temp 97.2°F | Ht 63.0 in | Wt 138.0 lb

## 2019-04-29 DIAGNOSIS — G5132 Clonic hemifacial spasm, left: Secondary | ICD-10-CM

## 2019-04-29 MED ORDER — INCOBOTULINUMTOXINA 50 UNITS IM SOLR
50.0000 [IU] | INTRAMUSCULAR | Status: DC
  Administered 2019-04-29: 17:00:00 50 [IU] via INTRAMUSCULAR

## 2019-04-29 NOTE — Progress Notes (Signed)
**  Xeomin 50 units x 1 vial, NDC 0259-1605-01, Lot [id number], Exp 05/2021, office supply.//mck,rn** 

## 2019-04-29 NOTE — Progress Notes (Signed)
PATIENT: Gabriela Johnson DOB: [birth year]  Chief Complaint  Patient presents with  . Left hemifacial spasm    Xeomin 50 units x 1 vial - office supply     HISTORICAL  Gabriela Johnson is of 43 year old female, seen in request by outside neurologist Dr. Audelia Acton for evaluation of left facial twitching, initial evaluation was on March 09, 2019.  I have reviewed and summarized the referring note from the referring physician.  She was previously healthy, began to noticed frequent left eye twitching around 2019, gradually getting worse, sometimes feel pulling of her left lower face, it has gradually getting worse over the past 1 year.  She denies visual loss, difficulty talking, for that reason, she was seen by outside neurologist, had MRI of brain, was offered gabapentin, 100 mg twice a day, but she has not taken it yet  We personally reviewed MRI of the brain without contrast from [facility] on January 27, 2019, several scattered small nonenhancing T2/hyperintense signal at bilateral cerebral white matter, predominantly in the frontal lobe, and subcortical region, likely small vessel disease She has occasionally headache in the past, couple times each month, mild to moderate, occasionally with light noise sensitivity, nauseous  UPDATE Apr 29 2019: This is her first EMG guided Xeomin injection for left hemifacial spasm, potential side effect explained.  REVIEW OF SYSTEMS: Full 14 system review of systems performed and notable only for as above All other review of systems were negative.  ALLERGIES: No Known Allergies  HOME MEDICATIONS: Current Outpatient Medications  Medication Sig Dispense Refill  . Cholecalciferol (VITAMIN D3 PO) Take 1,000 Units by mouth daily.    Marland Kitchen incobotulinumtoxinA (XEOMIN) 50 units SOLR injection Inject 50 Units into the muscle every 3 (three) months. 1 each 3  . Multiple Vitamin (MULTIVITAMIN) tablet Take 1 tablet by mouth daily.     No current  facility-administered medications for this visit.     PAST MEDICAL HISTORY: Past Medical History:  Diagnosis Date  . ACL tear 01/2013   right knee  . 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 Dental crowns present    also dental caps  . Facial twitching     PAST SURGICAL HISTORY: Past Surgical History:  Procedure Laterality Date  . ABDOMINAL HYSTERECTOMY    . ANTERIOR CRUCIATE LIGAMENT REPAIR Left 2002  . CESAREAN SECTION    . CHONDROPLASTY Right 01/20/2013   Procedure: CHONDROPLASTY RIGHT KNEE;  Surgeon: Hessie Dibble, MD;  Location: [facility];  Service: Orthopedics;  Laterality: Right;  . KNEE ARTHROSCOPY WITH ANTERIOR CRUCIATE LIGAMENT (ACL) REPAIR WITH HAMSTRING GRAFT Right 01/20/2013   Procedure: RIGHT KNEE ARTHROSCOPY WITH ANTERIOR CRUCIATE LIGAMENT (ACL) RECONSTRUCTION WITH ALLOGRAFT FROM PATELLA TENDON;  Surgeon: Hessie Dibble, MD;  Location: [facility];  Service: Orthopedics;  Laterality: Right;    FAMILY HISTORY: Family History  Problem Relation Age of Onset  . Scoliosis Mother   . Hypertension Father     SOCIAL HISTORY: Social History   Socioeconomic History  . Marital status: Married    Spouse name: Not on file  . Number of children: 2  . Years of education: two years of college  . Highest education level: Not on file  Occupational History  . 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 Financial resource strain: Not on file  . Food insecurity    Worry: Not on file    Inability: Not on file  .  Transportation needs    Medical: Not on file    Non-medical: Not on file  Tobacco Use  . Smoking status: Former Games developer  . Smokeless tobacco: Never Used  . Tobacco comment: stopped 4+ yrs ago - smoked 1ppd x 20 yrs  Substance and Sexual Activity  . Alcohol use: Yes    Comment: social  . Drug use: No  . Sexual activity: Not on file  Lifestyle  . Physical activity    Days per week: Not on  file    Minutes per session: Not on file  . Stress: Not on file  Relationships  . Social Musician on phone: Not on file    Gets together: Not on file    Attends religious service: Not on file    Active member of club or organization: Not on file    Attends meetings of clubs or organizations: Not on file    Relationship status: Not on file  . Intimate partner violence    Fear of current or ex partner: Not on file    Emotionally abused: Not on file    Physically abused: Not on file    Forced sexual activity: Not on file  Other Topics Concern  . Not on file  Social History Narrative   Lives at home with her family.   Right-handed.   Two cups caffeine daily.     PHYSICAL EXAM   Vitals:   04/29/19 1541  BP: (!) 136/93  Pulse: 93  Temp: (!) 97.2 F (36.2 C)  Weight: 138 lb (62.6 kg)  Height: 5\' 3"  (1.6 m)    Not recorded      Body mass index is 24.45 kg/m.  PHYSICAL EXAMNIATION:  NEUROLOGICAL EXAM:   CN VII: Frequent left orbicularis oculi muscle twitching, mild involvement of left cheek muscle.   DIAGNOSTIC DATA (LABS, IMAGING, TESTING) - I reviewed patient records, labs, notes, testing and imaging myself where available.   ASSESSMENT AND PLAN  Gabriela Johnson is a 43 y.o. female   Left hemifacial spasm EMG guided Xeomin injection, 50 units dissolved into 1 cc of normal saline, used total 25 units, discarded 25 units  Left orbicularis oculi at 2, 3, 4, 5, 6, 8 oclock (2.5units x6=15 units) Left frontalis 2.5 units Right frontalis 2.5 units Right orbicularis oculi at 8, 9 o'clock(2.5 units x 2= 5 units)   55, M.D. Ph.D.  [facility] [address] [place], [place] Ph: [phone] Fax: [phone]  CC: Referring Provider

## 2019-07-30 ENCOUNTER — Ambulatory Visit: Payer: BLUE CROSS/BLUE SHIELD | Admitting: Neurology

## 2019-08-04 ENCOUNTER — Telehealth: Payer: Self-pay

## 2019-08-04 NOTE — Telephone Encounter (Signed)
Codes 57846 and [phone] are valid billable and J codes needs pa at [phone]. UVO#[id number]  [id number](04/19/20)

## 2019-08-05 ENCOUNTER — Telehealth: Payer: Self-pay | Admitting: *Deleted

## 2019-08-05 ENCOUNTER — Encounter: Payer: Self-pay | Admitting: Neurology

## 2019-08-05 ENCOUNTER — Ambulatory Visit: Payer: BLUE CROSS/BLUE SHIELD | Admitting: Neurology

## 2019-08-05 NOTE — Telephone Encounter (Signed)
No showed Xeomin appointment. 

## 2019-11-04 ENCOUNTER — Ambulatory Visit: Payer: BLUE CROSS/BLUE SHIELD | Admitting: Neurology

## 2020-02-10 ENCOUNTER — Telehealth: Payer: Self-pay | Admitting: Neurology

## 2020-02-10 DIAGNOSIS — G5132 Clonic hemifacial spasm, left: Secondary | ICD-10-CM

## 2020-02-10 DIAGNOSIS — R9089 Other abnormal findings on diagnostic imaging of central nervous system: Secondary | ICD-10-CM

## 2020-02-10 NOTE — Addendum Note (Signed)
Addended by: Lindell Spar C on: 02/10/2020 12:12 PM   Modules accepted: Orders

## 2020-02-10 NOTE — Telephone Encounter (Addendum)
I spoke to the patient. [facility] requires a referral and she does not have an established PCP. Per vo by Dr. Terrace Arabia, place the referral in Epic. The patient is aware that this has been completed for her.

## 2020-02-10 NOTE — Telephone Encounter (Signed)
According to pt's insurance she has to be seen at [facility].  Pt is asking if Dr Terrace Arabia can refer her to [facility] in [facility] fax# [phone].  Pt is also asking that her records be sent there as well.

## 2020-08-01 ENCOUNTER — Encounter: Payer: Self-pay | Admitting: Neurology

## 2020-08-01 ENCOUNTER — Ambulatory Visit (INDEPENDENT_AMBULATORY_CARE_PROVIDER_SITE_OTHER): Payer: 59 | Admitting: Neurology

## 2020-08-01 ENCOUNTER — Other Ambulatory Visit: Payer: Self-pay

## 2020-08-01 VITALS — BP 128/78 | HR 80 | Ht 63.0 in | Wt 143.0 lb

## 2020-08-01 DIAGNOSIS — G43709 Chronic migraine without aura, not intractable, without status migrainosus: Secondary | ICD-10-CM

## 2020-08-01 DIAGNOSIS — G5132 Clonic hemifacial spasm, left: Secondary | ICD-10-CM

## 2020-08-01 DIAGNOSIS — R413 Other amnesia: Secondary | ICD-10-CM | POA: Diagnosis not present

## 2020-08-01 NOTE — Progress Notes (Signed)
PATIENT: Gabriela Johnson DOB: 1976-01-13  Chief Complaint  Patient presents with  . Follow-up    She is here to re-establish care for her hemifacial spasms. She has new insurance that will allow her to be seen by our office again. She would like to restart Xeomin. Her last injections were in 04/2019.     HISTORICAL  Gabriela Johnson is of 45 year old female, seen in request by outside neurologist Dr. Ellan Lambert for evaluation of left facial twitching, initial evaluation was on March 09, 2019.  I have reviewed and summarized the referring note from the referring physician.  She was previously healthy, began to noticed frequent left eye twitching around 2019, gradually getting worse, sometimes feel pulling of her left lower face, it has gradually getting worse over the past 1 year.  She denies visual loss, difficulty talking, for that reason, she was seen by outside neurologist, had MRI of brain, was offered gabapentin, 100 mg twice a day, but she has not taken it yet  We personally reviewed MRI of the brain without contrast from [facility] on January 27, 2019, several scattered small nonenhancing T2/hyperintense signal at bilateral cerebral white matter, predominantly in the frontal lobe, and subcortical region, likely small vessel disease She has occasionally headache in the past, couple times each month, mild to moderate, occasionally with light noise sensitivity, nauseous  UPDATE Apr 29 2019: This is her first EMG guided Xeomin injection for left hemifacial spasm, potential side effect explained.  UPDATE Aug 01 2020: She responded very well to previous injection, but due to insurance reasons, she was not able to continue receiving injection since last one in November 2020, she was seen by Dr. 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  ALLERGIES: No Known Allergies  HOME MEDICATIONS: Current Outpatient Medications  Medication Sig Dispense Refill  . Cholecalciferol (VITAMIN D3 PO) Take 1,000 Units by mouth daily.    . Multiple Vitamin (MULTIVITAMIN) tablet Take 1 tablet by mouth daily.     No current facility-administered medications for this visit.    PAST MEDICAL HISTORY: Past Medical History:  Diagnosis Date  . ACL tear 01/2013   right knee  . 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 Dental crowns present    also dental caps  . Facial twitching     PAST SURGICAL HISTORY: Past Surgical History:  Procedure Laterality Date  . ABDOMINAL HYSTERECTOMY    . ANTERIOR CRUCIATE LIGAMENT REPAIR Left 2002  . CESAREAN SECTION    . CHONDROPLASTY Right 01/20/2013   Procedure: CHONDROPLASTY RIGHT KNEE;  Surgeon: Velna Ochs, MD;  Location: [facility];  Service: Orthopedics;  Laterality: Right;  . KNEE ARTHROSCOPY WITH ANTERIOR CRUCIATE LIGAMENT (ACL) REPAIR WITH HAMSTRING GRAFT Right 01/20/2013   Procedure: RIGHT KNEE ARTHROSCOPY WITH ANTERIOR CRUCIATE LIGAMENT (ACL) RECONSTRUCTION WITH ALLOGRAFT FROM PATELLA TENDON;  Surgeon: Velna Ochs, MD;  Location: [facility];  Service: Orthopedics;  Laterality: Right;    FAMILY HISTORY: Family History  Problem Relation Age of Onset  . Scoliosis Mother   . Hypertension Father     SOCIAL HISTORY: Social History   Socioeconomic History  . Marital status: Married    Spouse name: Not on file  .  Number of children: 2  . Years of education: two years of college  . Highest education level: Not on file  Occupational History  . 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 Smoking status: Former Games developer  . Smokeless tobacco: Never Used  . Tobacco comment: stopped 4+ yrs ago - smoked 1ppd x 20 yrs  Substance and Sexual Activity  . Alcohol use: Yes    Comment: social  . Drug use: No  . Sexual activity: Not on file  Other Topics Concern  . Not on file  Social History Narrative   Lives at home with her family.   Right-handed.   Two cups caffeine daily.   Social Determinants of Health   Financial Resource Strain: Not on file  Food Insecurity: Not on file  Transportation Needs: Not on file  Physical Activity: Not on file  Stress: Not on file  Social Connections: Not on file  Intimate Partner Violence: Not on file     PHYSICAL EXAM   Vitals:   08/01/20 1331  BP: 128/78  Pulse: 80  Weight: 143 lb (64.9 kg)  Height: 5\' 3"  (1.6 m)   Not recorded     Body mass index is 25.33 kg/m.  PHYSICAL EXAMNIATION:  NEUROLOGICAL EXAM:   CN VII: Frequent left orbicularis oculi muscle twitching, mild involvement of left cheek muscle.   DIAGNOSTIC DATA (LABS, IMAGING, TESTING) - I reviewed patient records, labs, notes, testing and imaging myself where available.   ASSESSMENT AND PLAN  Gabriela Johnson is a 45 y.o. female   Left hemifacial spasm  Preauthorization for Xeomin 50 units  Chronic migraine  I have suggested magnesium oxide 400 mg p.o. riboflavin B2 200 mg twice a day as preventive medications  Aleve as needed, may combine with some caffeine containing beverage  Abnormal MRI brain  She also complains of memory loss, previously described more than normal age supratentorium T2/flair lesions, increased frequency of headaches,  She desires further evaluation, will proceed with MRI of brain  59, M.D. Ph.D.  [facility] [address], [address] [place], [place] Ph: [phone] Fax: [phone]  CC: Referring Provider

## 2020-08-02 ENCOUNTER — Telehealth: Payer: Self-pay | Admitting: Neurology

## 2020-08-02 NOTE — Telephone Encounter (Signed)
Received charge sheet for 50 units of Xeomin for G51.3. Signed patient consent form given to medical records for processing. Filled out [facility] PA form for [phone] and [id number]. Faxed to plan with notes.

## 2020-08-02 NOTE — Telephone Encounter (Signed)
[employer] auth: [id number] (exp. 08/02/20 to 08/31/20). They will reach out to the patient to schedule.

## 2020-08-04 NOTE — Telephone Encounter (Signed)
Received approval via fax this afternoon. PA #[id number] (08/02/20- 10/31/20). I will fill out prescription enrollment form for [facility] and give to MD to sign.

## 2020-08-04 NOTE — Telephone Encounter (Signed)
[employer] ([facility] called, ICD-10 code 43568 for 1 visit has been approved. [id number] 50 units has been approved. Reference no: [id number] Sending a copy of approval letter in 2-5 business days.

## 2020-08-08 NOTE — Telephone Encounter (Signed)
Faxed signed prescription enrollment form to [facility]. I called the patient and scheduled her Xeomin appointment for 3/9. Advised patient of specialty pharmacy process. Patient verbalized understanding.

## 2020-08-10 NOTE — Telephone Encounter (Signed)
I called [employer] [phone]) and spoke with Julio Alm to check status of Xeomin order. Julio Alm states that it is requiring PA through [employer]. She provided me with their phone number, [phone]. I called and spoke with Victorino Dike to initiate PA. She states that it will take 24-72 hours to reach a decision.

## 2020-08-15 ENCOUNTER — Telehealth: Payer: Self-pay | Admitting: Neurology

## 2020-08-15 ENCOUNTER — Ambulatory Visit
Admission: RE | Admit: 2020-08-15 | Discharge: 2020-08-15 | Disposition: A | Discharge status: Home / Self Care | Payer: 59 | Source: Ambulatory Visit | Attending: Neurology | Admitting: Neurology

## 2020-08-15 ENCOUNTER — Other Ambulatory Visit: Payer: Self-pay

## 2020-08-15 DIAGNOSIS — G43709 Chronic migraine without aura, not intractable, without status migrainosus: Secondary | ICD-10-CM

## 2020-08-15 DIAGNOSIS — R413 Other amnesia: Secondary | ICD-10-CM

## 2020-08-15 DIAGNOSIS — G5132 Clonic hemifacial spasm, left: Secondary | ICD-10-CM

## 2020-08-15 NOTE — Telephone Encounter (Signed)
Received approval from [employer]. PA #[id number] (08/12/20- 08/11/21). I called [facility] [phone]) and spoke with Julio Alm to check the status. She states they have been reaching out to the patient to get consent but haven't been able to reach her. I called the patient to advise. She states she would call them when our call ended.

## 2020-08-15 NOTE — Telephone Encounter (Signed)
   IMPRESSION:  This MRI of the brain without contrast shows: 1.   Scattered T2/FLAIR hyperintense foci in the subcortical and deep white matter consistent with mild chronic microvascular ischemic change or the sequela of migraine headache.  None of these appear to be acute. 2.   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  Consider a follow-up study with and without contrast to assess stability in several months.  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

## 2020-08-15 NOTE — Telephone Encounter (Signed)
Received call from [facility] with [facility] to schedule delivery of Xeomin. Xeomin TBD 3/8.

## 2020-08-16 NOTE — Telephone Encounter (Signed)
I spoke to the patient and provided her with the MRI brain results. She has her disc from her previous scan and will bring it to her next pending appt.

## 2020-08-16 NOTE — Telephone Encounter (Signed)
Pt returned call. Please call back when available. 

## 2020-08-16 NOTE — Telephone Encounter (Signed)
Left message for a return call

## 2020-08-16 NOTE — Telephone Encounter (Signed)
Received (1) 50 unit vial of Xeomin today from [facility] [facility]. Lot #[id number] (Exp. 12/22).

## 2020-08-17 ENCOUNTER — Ambulatory Visit (INDEPENDENT_AMBULATORY_CARE_PROVIDER_SITE_OTHER): Payer: 59 | Admitting: Neurology

## 2020-08-17 ENCOUNTER — Encounter: Payer: Self-pay | Admitting: Neurology

## 2020-08-17 ENCOUNTER — Other Ambulatory Visit: Payer: Self-pay

## 2020-08-17 VITALS — BP 147/89 | HR 71 | Ht 63.0 in | Wt 136.0 lb

## 2020-08-17 DIAGNOSIS — G5132 Clonic hemifacial spasm, left: Secondary | ICD-10-CM | POA: Diagnosis not present

## 2020-08-17 DIAGNOSIS — G43709 Chronic migraine without aura, not intractable, without status migrainosus: Secondary | ICD-10-CM

## 2020-08-17 DIAGNOSIS — R9089 Other abnormal findings on diagnostic imaging of central nervous system: Secondary | ICD-10-CM

## 2020-08-17 NOTE — Progress Notes (Signed)
PATIENT: Gabriela Johnson DOB: 13-Sep-1975  Chief Complaint  Patient presents with  . Procedure    Hemifacial Spasms - Xeomin. She is here with her husband, Gabriela Johnson.     HISTORICAL  Gabriela Johnson is of 45 year old female, seen in request by outside neurologist Dr. Ellan Lambert for evaluation of left facial twitching, initial evaluation was on March 09, 2019.  I have reviewed and summarized the referring note from the referring physician.  She was previously healthy, began to noticed frequent left eye twitching around 2019, gradually getting worse, sometimes feel pulling of her left lower face, it has gradually getting worse over the past 1 year.  She denies visual loss, difficulty talking, for that reason, she was seen by outside neurologist, had MRI of brain, was offered gabapentin, 100 mg twice a day, but she has not taken it yet  We personally reviewed MRI of the brain without contrast from [facility] on January 27, 2019, several scattered small nonenhancing T2/hyperintense signal at bilateral cerebral white matter, predominantly in the frontal lobe, and subcortical region, likely small vessel disease She has occasionally headache in the past, couple times each month, mild to moderate, occasionally with light noise sensitivity, nauseous  UPDATE Apr 29 2019: This is her first EMG guided Xeomin injection for left hemifacial spasm, potential side effect explained.  UPDATE Aug 01 2020: She responded very well to previous injection, but due to insurance reasons, she was not able to continue receiving injection since last one in November 2020, she was seen by Dr. 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, she complains of mild memory loss, word finding difficulties, but denies lateralized motor or sensory deficit  Update August 17, 2020: Her headache is overall under good control, intermittent right parietal region headaches, dull achy, no significant light noise sensitivity, no nausea, as needed NSAIDs was helpful  We personally reviewed repeat MRI of the brain without contrast August 15, 2020 in comparison to previous scan in August 2020 from [facility], scattered T2/flair hyperintensity in the subcortical, deep white matter consistent with mild chronic microvascular ischemic changes, or sequelae of migraine headaches, 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  ALLERGIES: No Known Allergies  HOME MEDICATIONS: Current Outpatient Medications  Medication Sig Dispense Refill  . Cholecalciferol (VITAMIN D3 PO) Take 1,000 Units by mouth daily.    . Multiple Vitamin (MULTIVITAMIN) tablet Take 1 tablet by mouth daily.    Marland Kitchen XEOMIN 50 units SOLR injection Inject 50 Units into the muscle every 3 (three) months.     No current facility-administered medications for this visit.    PAST MEDICAL HISTORY: Past Medical History:  Diagnosis Date  . ACL tear 01/2013   right knee  . 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 Dental crowns present    also dental caps  . Facial twitching     PAST SURGICAL HISTORY: Past Surgical History:  Procedure Laterality Date  . ABDOMINAL  HYSTERECTOMY    . ANTERIOR CRUCIATE LIGAMENT REPAIR Left 2002  . CESAREAN SECTION    . CHONDROPLASTY Right 01/20/2013   Procedure: CHONDROPLASTY RIGHT KNEE;  Surgeon: Velna Ochs, MD;  Location: [facility];  Service: Orthopedics;  Laterality: Right;  . KNEE ARTHROSCOPY WITH ANTERIOR CRUCIATE LIGAMENT (ACL) REPAIR WITH HAMSTRING GRAFT Right 01/20/2013   Procedure: RIGHT KNEE ARTHROSCOPY WITH ANTERIOR CRUCIATE LIGAMENT (ACL) RECONSTRUCTION WITH ALLOGRAFT FROM PATELLA TENDON;  Surgeon: Velna Ochs, MD;  Location: [facility];  Service: Orthopedics;  Laterality: Right;    FAMILY HISTORY: Family History  Problem Relation Age of Onset  . Scoliosis Mother   . Hypertension Father     SOCIAL HISTORY: Social History   Socioeconomic History  . Marital status: Married    Spouse name: Not on file  . Number of children: 2  . Years of education: two years of college  . Highest education level: Not on file  Occupational History  . 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 Smoking status: Former Games developer  . Smokeless tobacco: Never Used  . Tobacco comment: stopped 4+ yrs ago - smoked 1ppd x 20 yrs  Substance and Sexual Activity  . Alcohol use: Yes    Comment: social  . Drug use: No  . Sexual activity: Not on file  Other Topics Concern  . Not on file  Social History Narrative   Lives at home with her family.   Right-handed.   Two cups caffeine daily.   Social Determinants of Health   Financial Resource Strain: Not on file  Food Insecurity: Not on file  Transportation Needs: Not on file  Physical Activity: Not on file  Stress: Not on file  Social Connections: Not on file  Intimate Partner Violence: Not on file     PHYSICAL EXAM   Vitals:   08/17/20 1134  BP: (!) 147/89  Pulse: 71  Weight: 136 lb (61.7 kg)  Height: 5\' 3"  (1.6 m)   Not recorded     Body mass index is 24.09 kg/m.  PHYSICAL  EXAMNIATION:  PHYSICAL EXAMNIATION:  Gen: NAD, conversant, well nourised, well groomed                     Cardiovascular: Regular rate rhythm, no peripheral edema, warm, nontender. Eyes: Conjunctivae clear without exudates or hemorrhage Neck: Supple, no carotid bruits. Pulmonary: Clear to auscultation bilaterally   NEUROLOGICAL EXAM:  MENTAL STATUS: Speech/Cognition: Awake, alert, normal speech, oriented to history taking and casual conversation.  CRANIAL NERVES: CN II: Visual fields are full to confrontation.  Pupils are round equal and briskly reactive to light. CN III, IV, VI: extraocular movement are normal. No ptosis. CN V: Facial sensation is intact to light touch. CN VII: Frequent left facial muscle twitching, mainly involving left orbicularis oculi, spreading to left cheek muscle occasionally CN VIII: Hearing is normal to casual conversation CN IX, X: Palate elevates symmetrically. Phonation is normal. CN XI: Head turning and shoulder shrug are intact CN XII: Tongue is midline with normal movements and no atrophy.  MOTOR: Muscle bulk and tone are normal. Muscle strength is normal.  REFLEXES: Reflexes are 2  and symmetric at the biceps, triceps, knees and ankles. Plantar responses  are flexor.  SENSORY: Intact to light touch, pinprick, positional and vibratory sensation at fingers and toes.  COORDINATION: There is no trunk or limb ataxia.    GAIT/STANCE: Posture is normal. Gait is steady with normal steps, base, arm swing and turning.    DIAGNOSTIC DATA (LABS, IMAGING, TESTING) - I reviewed patient records, labs, notes, testing and imaging myself where available.   ASSESSMENT AND PLAN  Maylin L Hilgeman is a 45 y.o. female   Chronic migraine  I have suggested magnesium oxide 400 mg p.o. 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, there was also incidental findings of asymmetric enlargement of pineal gland, with chronic hemosiderin deposit, which was also present at the previous MRI of the brain with without contrast in August 2020 from [facility], no evidence of hydrocephalus, no aqueduct stenosis  We will repeat MRI of the brain with and without contrast in 1 year  Left hemifacial spasm  EMG guided Xeomin injection, used 50 units, used 42.5 units, discarded 7.5 units  Left orbicularis oculi at 2, 3, 4, 5, 6, 7, 8 o'clock position (2.5 units x7= 17.5 units) Right orbicularis oculi at 7, 8, 9 o'clock position (2.5 units x 3= 7.5 units) Right frontalis 5 units Left frontalis 5 units Right corrugate 2.5 units/left corrugate 2.5 units Procerus 5 units     Levert Feinstein, M.D. Ph.D.  [facility] [address], [address] [place], [place] [address] Ph: [phone] Fax: [phone]  CC: Referring Provider

## 2020-08-17 NOTE — Progress Notes (Signed)
**  Xeomin 50 units x 1 vials, NDC 9191-6606-00, Lot [id number], Exp 05/2021, specialty pharmacy.//mck,rn

## 2020-09-08 ENCOUNTER — Ambulatory Visit: Payer: BLUE CROSS/BLUE SHIELD | Admitting: Physician Assistant

## 2020-11-03 ENCOUNTER — Telehealth: Payer: Self-pay | Admitting: Neurology

## 2020-11-03 NOTE — Telephone Encounter (Signed)
Patient has a Xeomin appointment 6/15. Her PA with [facility] expired 10/31/20. I filled out PA form and will submit to insurance.

## 2020-11-08 NOTE — Telephone Encounter (Addendum)
I called the patient to inquire. She states she will not be seeing [facility] for injections. I submitted the PA to [facility] today after call ended.

## 2020-11-08 NOTE — Telephone Encounter (Signed)
Received approval from [facility]. PA #[id number] (11/03/20- 02/03/21).

## 2020-11-14 NOTE — Telephone Encounter (Signed)
I called [facility] [phone]) and spoke with Kyla to schedule Xeomin delivery. She states they have been trying to contact patient for shipment consent. I called the patient and LVM asking her to call the pharmacy.

## 2020-11-16 NOTE — Telephone Encounter (Signed)
I called [employer] again today and spoke with Erie Noe to check status. Erie Noe states Xeomin TBD 6/14.

## 2020-11-22 NOTE — Telephone Encounter (Signed)
Received (1) 50 unit vial of Xeomin today from pharmacy.

## 2020-11-23 ENCOUNTER — Ambulatory Visit (INDEPENDENT_AMBULATORY_CARE_PROVIDER_SITE_OTHER): Payer: 59 | Admitting: Neurology

## 2020-11-23 ENCOUNTER — Encounter: Payer: Self-pay | Admitting: Neurology

## 2020-11-23 ENCOUNTER — Other Ambulatory Visit: Payer: Self-pay

## 2020-11-23 VITALS — BP 136/88 | HR 92 | Ht 63.0 in | Wt 137.0 lb

## 2020-11-23 DIAGNOSIS — G5132 Clonic hemifacial spasm, left: Secondary | ICD-10-CM | POA: Diagnosis not present

## 2020-11-23 NOTE — Progress Notes (Signed)
**  Xeomn 50 units, NDC L9622215, Lot [id number], Exp 12/2021, specialty pharmacy.//mck,rn**

## 2020-11-23 NOTE — Progress Notes (Signed)
PATIENT: Gabriela Johnson DOB: 08/03/1975  Chief Complaint  Patient presents with   Procedure    Xeomin     HISTORICAL  Lawrence L Jiggetts is of 45 year old female, seen in request by outside neurologist Dr. Ellan Lambert for evaluation of left facial twitching, initial evaluation was on March 09, 2019.  I have reviewed and summarized the referring note from the referring physician.  She was previously healthy, began to noticed frequent left eye twitching around 2019, gradually getting worse, sometimes feel pulling of her left lower face, it has gradually getting worse over the past 1 year.  She denies visual loss, difficulty talking, for that reason, she was seen by outside neurologist, had MRI of brain, was offered gabapentin, 100 mg twice a day, but she has not taken it yet  We personally reviewed MRI of the brain without contrast from [facility] on January 27, 2019, several scattered small nonenhancing T2/hyperintense signal at bilateral cerebral white matter, predominantly in the frontal lobe, and subcortical region, likely small vessel disease She has occasionally headache in the past, couple times each month, mild to moderate, occasionally with light noise sensitivity, nauseous  UPDATE Apr 29 2019: This is her first EMG guided Xeomin injection for left hemifacial spasm, potential side effect explained.  UPDATE Aug 01 2020: She responded very well to previous injection, but due to insurance reasons, she was not able to continue receiving injection since last one in November 2020, she was seen by Dr. 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,  she complains of mild memory loss, word finding difficulties, but denies lateralized motor or sensory deficit  Update August 17, 2020: Her headache is overall under good control, intermittent right parietal region headaches, dull achy, no significant light noise sensitivity, no nausea, as needed NSAIDs was helpful  We personally reviewed repeat MRI of the brain without contrast August 15, 2020 in comparison to previous scan in August 2020 from [facility], scattered T2/flair hyperintensity in the subcortical, deep white matter consistent with mild chronic microvascular ischemic changes, or sequelae of migraine headaches, the 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, hemosiderin deposit is noted consistent with chronic microhemorrhage,  Patient denies sudden loss of vision, no significant worsening of headache, no evidence of hydrocephalus  She did complain worsening left hemifacial spasm, responded very well to previous injection in November 2020, will repeat injection use Xeomin 50 units today  UPDATE June 15th 2022: She tolerated previous injection well, no significant side effect, continue has mild headache every 2 to 3 days, take over-the-counter ibuprofen, does not want to go on daily preventive medication or prescription triptan  REVIEW OF SYSTEMS: Full 14 system review of systems performed and notable only for as above All other review of systems were negative.  ALLERGIES: No Known Allergies  HOME MEDICATIONS: Current Outpatient Medications  Medication Sig Dispense Refill   Cholecalciferol (VITAMIN D3 PO) Take 1,000 Units by mouth daily.     Multiple Vitamin (MULTIVITAMIN) tablet Take 1 tablet by mouth daily.     XEOMIN 50 units SOLR injection Inject 50 Units into the muscle every 3 (three) months.     No current facility-administered medications for  this visit.    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   Dental crowns present    also dental caps   Facial twitching     PAST SURGICAL HISTORY: Past Surgical History:  Procedure Laterality Date   ABDOMINAL HYSTERECTOMY     ANTERIOR CRUCIATE LIGAMENT REPAIR Left 2002   CESAREAN SECTION     CHONDROPLASTY Right 01/20/2013   Procedure: CHONDROPLASTY RIGHT KNEE;  Surgeon: Velna Ochs, MD;  Location: [facility];  Service: Orthopedics;  Laterality: Right;   KNEE ARTHROSCOPY WITH ANTERIOR CRUCIATE LIGAMENT (ACL) REPAIR WITH HAMSTRING GRAFT Right 01/20/2013   Procedure: RIGHT KNEE ARTHROSCOPY WITH ANTERIOR CRUCIATE LIGAMENT (ACL) RECONSTRUCTION WITH ALLOGRAFT FROM PATELLA TENDON;  Surgeon: Velna Ochs, MD;  Location: [facility];  Service: Orthopedics;  Laterality: Right;    FAMILY HISTORY: Family History  Problem Relation Age of Onset   Scoliosis Mother    Hypertension Father     SOCIAL HISTORY: Social History   Socioeconomic History   Marital status: Married    Spouse name: Not on file   Number of children: 2   Years of education: two years of college   Highest education level: Not on file  Occupational History   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   Right-handed.   Two cups caffeine daily.   Social Determinants of Health   Financial Resource Strain: Not on file  Food Insecurity: Not on file  Transportation Needs: Not on file  Physical Activity: Not on file  Stress: Not on file  Social Connections: Not on file  Intimate Partner Violence: Not on file     PHYSICAL EXAM   Vitals:    11/23/20 1317  BP: 136/88  Pulse: 92  Weight: 137 lb (62.1 kg)  Height: 5\' 3"  (1.6 m)   Not recorded     Body mass index is 24.27 kg/m.  PHYSICAL EXAMNIATION:  Occasionally muscle twitching around left orbicularis oculi, left frontalis, left cheek muscles  ASSESSMENT AND PLAN  Carlyon L Iezzi is a 45 y.o. female   Chronic migraine  I have suggested magnesium oxide 400 mg p.o. 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, there was also incidental findings of asymmetric enlargement of pineal gland, with chronic hemosiderin deposit, which was also present at the previous MRI of the brain with without contrast in August 2020 from [facility], no evidence of hydrocephalus, no aqueduct stenosis  We will repeat MRI of the brain with and without contrast in 1 year  Left hemifacial spasm  EMG guided Xeomin injection,  Xeomin 50 units, dissolved in 1 cc of normal saline, used 35 units, discard 15 units  Left  orbicularis oculi at 2,3,4,6,7, (2.5 units x5=12.5 units)  Right orbicularis oculi at 8, 9 (2.5 unitsx2=5 units)  Left frontalis 2.5 units x 2= 5.0 units Right frontalis 2.5 units x 2= 5.0 units  Right corrugate 2.5 units Left corrugate 2.5 units Prosperous 2.5 units   Levert Feinstein, M.D. Ph.D.  [facility] [address] [place], [place] [address] Ph: [phone] Fax: [phone]  CC: Referring Provider

## 2021-02-06 ENCOUNTER — Telehealth: Payer: Self-pay | Admitting: Neurology

## 2021-02-06 NOTE — Telephone Encounter (Signed)
Patient has a Xeomin appointment 9/21. PA with [facility] for Xeomin expired 8/26. I completed new PA request for Xeomin and received approval. PA #[id number] (02/03/21- 05/06/21).

## 2021-02-20 NOTE — Telephone Encounter (Signed)
I called [facility] @ [phone] to try to set up Xeomin delivery for appointment next week. Patient needs to provide consent. I called patient and LVM to advise.

## 2021-02-22 NOTE — Telephone Encounter (Signed)
I called [facility] to check the order status. Patient has not given consent. I called patient and LVM.

## 2021-02-28 NOTE — Telephone Encounter (Signed)
Patient returned my call. She states she would like to r/s tomorrow's appointment because she is losing her insurance. She will not have coverage again until January. Per patient request, we r/s her Xeomin injection for January. Scheduled for 1/25 to make sure there is enough time to get insurance authorization and get Xeomin from pharmacy.

## 2021-03-01 ENCOUNTER — Ambulatory Visit: Payer: 59 | Admitting: Neurology

## 2021-07-03 ENCOUNTER — Telehealth: Payer: Self-pay | Admitting: Neurology

## 2021-07-03 NOTE — Telephone Encounter (Signed)
Patient returned my call. She provided her new Environmental consultant Scientist, clinical (histocompatibility and immunogenetics)). I have updated her chart to reflect this.

## 2021-07-03 NOTE — Telephone Encounter (Signed)
Patient has a Xeomin injection 1/25. We have called the patient 3 times & left messages and have not heard anything back regarding new insurance info. She will need to provide this before coming in for her appointment.

## 2021-07-04 NOTE — Telephone Encounter (Signed)
Faxed request to [facility] for PA of Xeomin.

## 2021-07-05 ENCOUNTER — Ambulatory Visit: Payer: Self-pay | Admitting: Neurology

## 2021-08-30 NOTE — Telephone Encounter (Signed)
Gabriela Johnson spoke with Gabriela Johnson states Xeomin is not a covered benefit under patients plan. Reference number for the call [id number] and the time 12:32 pm. ?

## 2021-09-04 DIAGNOSIS — N390 Urinary tract infection, site not specified: Secondary | ICD-10-CM | POA: Diagnosis not present

## 2021-09-20 DIAGNOSIS — Z01419 Encounter for gynecological examination (general) (routine) without abnormal findings: Secondary | ICD-10-CM | POA: Diagnosis not present

## 2021-09-20 DIAGNOSIS — Z6824 Body mass index (BMI) 24.0-24.9, adult: Secondary | ICD-10-CM | POA: Diagnosis not present

## 2021-09-20 DIAGNOSIS — Z1231 Encounter for screening mammogram for malignant neoplasm of breast: Secondary | ICD-10-CM | POA: Diagnosis not present

## 2021-09-20 DIAGNOSIS — Z1272 Encounter for screening for malignant neoplasm of vagina: Secondary | ICD-10-CM | POA: Diagnosis not present

## 2021-10-17 NOTE — Telephone Encounter (Signed)
Hemifacial spasm is clear indication for botulism toxin injection treatment, ? ?If her insurance does not agree xeomin, possible Botox A or Dysport ?

## 2021-10-17 NOTE — Telephone Encounter (Signed)
Pt's husband called wanting to know if there is another alternative the pt can try now that her insurance will not cover the Xeomin inj. Please call Husband Ovid Curd back instead of pt. [phone] ? ?

## 2021-11-13 NOTE — Telephone Encounter (Signed)
Completed Botox PA form, faxed with OV notes to [facility].

## 2022-01-11 DIAGNOSIS — Z6823 Body mass index (BMI) 23.0-23.9, adult: Secondary | ICD-10-CM | POA: Diagnosis not present

## 2022-01-11 DIAGNOSIS — R350 Frequency of micturition: Secondary | ICD-10-CM | POA: Diagnosis not present

## 2022-01-11 DIAGNOSIS — R319 Hematuria, unspecified: Secondary | ICD-10-CM | POA: Diagnosis not present

## 2022-01-11 DIAGNOSIS — N390 Urinary tract infection, site not specified: Secondary | ICD-10-CM | POA: Diagnosis not present

## 2022-02-22 ENCOUNTER — Telehealth: Payer: Self-pay | Admitting: Neurology

## 2022-02-22 NOTE — Telephone Encounter (Signed)
Pt called and left on voicemail. Pt stated she needed Dr. Charna Elizabeth to fill out a ESA form for her. Pt is requesting a call back from nurse.

## 2022-02-26 NOTE — Telephone Encounter (Signed)
Last visit with our office was on November 23, 2020, she should ask her primary care to do so

## 2022-02-26 NOTE — Telephone Encounter (Signed)
I received pw. Pt is asking for Dr. Krista Blue to sign off on Emotional Support Animal reason for social and anxiety.   I will verify from MD if ok to process and complete this paper work.

## 2022-02-26 NOTE — Telephone Encounter (Signed)
Mychart message sent to the pt on this. 

## 2022-02-26 NOTE — Telephone Encounter (Signed)
I have updated medical records on this and they will advise the pt.

## 2022-03-06 NOTE — Telephone Encounter (Signed)
Called Patient to advise that Dr. Krista Blue noted paperwork to be completed by PCP. Patient stated that she does not have  PCP and since Dr. Krista Blue diagnosed her she feels that she is the best person to complete for her. Please contact the patient to advise.

## 2022-05-01 ENCOUNTER — Telehealth: Payer: Self-pay | Admitting: Neurology

## 2022-05-01 NOTE — Telephone Encounter (Signed)
Pt is calling. Stated she wants to speak with nurse about starting Botox. Stated she wants to discuss prices because her insurance doesn't cover the botox,

## 2022-05-01 NOTE — Telephone Encounter (Signed)
Can we submit for Xeomin 50 units  Dx G51.32, Hemifacial Spasm ( [id number] & [phone]).  Thank you

## 2022-05-02 NOTE — Telephone Encounter (Signed)
Submitted Xeomin Portal Benefit Review

## 2022-05-08 ENCOUNTER — Telehealth: Payer: Self-pay | Admitting: Neurology

## 2022-05-08 NOTE — Telephone Encounter (Signed)
Pt called needing to speak to the RN to find out about her Xeomin. She states that an insurance rep is coming over today and she is needing to know the cost so that she can inform the insurance and she can make a decision of coverage then. Please advise.

## 2022-05-08 NOTE — Telephone Encounter (Signed)
Pt is calling. Wanting to schedule Botox appointment.

## 2022-05-09 DIAGNOSIS — R051 Acute cough: Secondary | ICD-10-CM | POA: Diagnosis not present

## 2022-05-09 DIAGNOSIS — Z6823 Body mass index (BMI) 23.0-23.9, adult: Secondary | ICD-10-CM | POA: Diagnosis not present

## 2022-05-09 DIAGNOSIS — R030 Elevated blood-pressure reading, without diagnosis of hypertension: Secondary | ICD-10-CM | POA: Diagnosis not present

## 2022-05-09 DIAGNOSIS — J069 Acute upper respiratory infection, unspecified: Secondary | ICD-10-CM | POA: Diagnosis not present

## 2022-05-10 ENCOUNTER — Telehealth: Payer: Self-pay

## 2022-05-10 NOTE — Telephone Encounter (Signed)
Patient was sent a mychart message regarding insurance coverage and scheduling.

## 2022-05-10 NOTE — Telephone Encounter (Signed)
Message sent to patient regarding Botox.

## 2022-05-15 ENCOUNTER — Telehealth: Payer: Self-pay

## 2022-05-17 NOTE — Telephone Encounter (Signed)
Error

## 2022-06-26 DIAGNOSIS — D125 Benign neoplasm of sigmoid colon: Secondary | ICD-10-CM | POA: Diagnosis not present

## 2022-06-26 DIAGNOSIS — K648 Other hemorrhoids: Secondary | ICD-10-CM | POA: Diagnosis not present

## 2022-06-26 DIAGNOSIS — Z1211 Encounter for screening for malignant neoplasm of colon: Secondary | ICD-10-CM | POA: Diagnosis not present

## 2022-06-26 DIAGNOSIS — K5730 Diverticulosis of large intestine without perforation or abscess without bleeding: Secondary | ICD-10-CM | POA: Diagnosis not present

## 2022-06-26 DIAGNOSIS — D124 Benign neoplasm of descending colon: Secondary | ICD-10-CM | POA: Diagnosis not present

## 2022-07-11 ENCOUNTER — Ambulatory Visit (INDEPENDENT_AMBULATORY_CARE_PROVIDER_SITE_OTHER): Payer: Commercial Managed Care - PPO

## 2022-07-11 ENCOUNTER — Ambulatory Visit (INDEPENDENT_AMBULATORY_CARE_PROVIDER_SITE_OTHER): Payer: 59 | Admitting: Podiatry

## 2022-07-11 DIAGNOSIS — S97102A Crushing injury of unspecified left toe(s), initial encounter: Secondary | ICD-10-CM

## 2022-07-11 MED ORDER — MELOXICAM 15 MG PO TABS: 15.0000 mg | ORAL_TABLET | Freq: Every day | ORAL | 2 refills | Status: AC

## 2022-07-11 NOTE — Progress Notes (Signed)
Subjective:   Patient ID: Gabriela Johnson, female   DOB: 47 y.o.   MRN: [id number]   HPI Patient presents stating she had a lot of pain in the third toe of her left foot and it has been swollen and hard to wear shoe gear with.  She traumatized it in November and it has been bothering her since then   Review of Systems  All other systems reviewed and are negative.       Objective:  Physical Exam Vitals and nursing note reviewed.  Constitutional:      Appearance: She is well-developed.  Pulmonary:     Effort: Pulmonary effort is normal.  Musculoskeletal:        General: Normal range of motion.  Skin:    General: Skin is warm.  Neurological:     Mental Status: She is alert.     Neurovascular status intact muscle strength found to be adequate range of motion within normal limits with patient noted to have inflamed third digit left foot with swelling of the plantar surface localized with no indications of abnormal position of the toe.  Good digital perfusion well-oriented x 3     Assessment:  Possibility for fracture of the third digit left versus other pathology     Plan:  H&P x-rays reviewed and recommended anti-inflammatories and we placed her on Mobic to take at this time.  I then discussed injection she does not want it but may be required long-term  X-rays indicate possibility of a previous fracture third digit left proximal phalanx but nothing that appears to be acute at the current time

## 2022-11-22 ENCOUNTER — Ambulatory Visit: Payer: Commercial Managed Care - PPO | Admitting: Family Medicine

## 2023-02-28 ENCOUNTER — Ambulatory Visit: Payer: Commercial Managed Care - PPO | Admitting: Neurology

## 2023-04-16 DIAGNOSIS — G5132 Clonic hemifacial spasm, left: Secondary | ICD-10-CM | POA: Diagnosis not present

## 2023-04-25 DIAGNOSIS — G5132 Clonic hemifacial spasm, left: Secondary | ICD-10-CM | POA: Diagnosis not present

## 2023-05-30 DIAGNOSIS — G5132 Clonic hemifacial spasm, left: Secondary | ICD-10-CM | POA: Diagnosis not present

## 2023-06-19 DIAGNOSIS — G5132 Clonic hemifacial spasm, left: Secondary | ICD-10-CM | POA: Diagnosis not present

## 2023-06-19 DIAGNOSIS — Z9889 Other specified postprocedural states: Secondary | ICD-10-CM | POA: Diagnosis not present

## 2023-06-19 DIAGNOSIS — F1010 Alcohol abuse, uncomplicated: Secondary | ICD-10-CM | POA: Diagnosis not present

## 2023-06-19 DIAGNOSIS — Z01818 Encounter for other preprocedural examination: Secondary | ICD-10-CM | POA: Diagnosis not present

## 2023-06-19 DIAGNOSIS — R112 Nausea with vomiting, unspecified: Secondary | ICD-10-CM | POA: Diagnosis not present

## 2023-06-24 DIAGNOSIS — M25561 Pain in right knee: Secondary | ICD-10-CM | POA: Diagnosis not present

## 2023-07-17 DIAGNOSIS — Z87891 Personal history of nicotine dependence: Secondary | ICD-10-CM | POA: Diagnosis not present

## 2023-07-17 DIAGNOSIS — I498 Other specified cardiac arrhythmias: Secondary | ICD-10-CM | POA: Diagnosis not present

## 2023-07-17 DIAGNOSIS — G5132 Clonic hemifacial spasm, left: Secondary | ICD-10-CM | POA: Diagnosis not present

## 2023-07-17 DIAGNOSIS — I10 Essential (primary) hypertension: Secondary | ICD-10-CM | POA: Diagnosis not present

## 2023-07-17 DIAGNOSIS — Z90710 Acquired absence of both cervix and uterus: Secondary | ICD-10-CM | POA: Diagnosis not present

## 2023-07-17 HISTORY — PX: BRAIN SURGERY: SHX531

## 2023-07-30 ENCOUNTER — Ambulatory Visit
Admission: RE | Admit: 2023-07-30 | Discharge: 2023-07-30 | Disposition: A | Discharge status: Home / Self Care | Payer: 59 | Source: Ambulatory Visit

## 2023-07-30 ENCOUNTER — Other Ambulatory Visit: Payer: Self-pay

## 2023-07-30 NOTE — ED Provider Notes (Signed)
S/P craniotomy, 2 weeks ago at [facility] neurosurgery, chart reviewed and per surgical postop instructions patient was to follow-up with [facility] neurology 2 weeks and see the surgeon again in 4 weeks.  Patient advised that this provider does not remove postoperative surgical sutures will need to follow back up with her surgical office.  Advised and I am not aware of any urgent care that we will remove post surgical sutures for we removed sutures for laceration repairs staples for simple head injuries reiterated to patient that her sutures are laded to a surgical procedure which routinely requires postoperative follow-up with the surgeon who placed them.  Patient was advised to return to the front desk to obtain a refund   Bing Neighbors, [place] 07/30/23 [phone]

## 2023-07-30 NOTE — ED Triage Notes (Signed)
Pt here for staple removal to left side of head behind ear from craniotomy

## 2023-09-05 ENCOUNTER — Other Ambulatory Visit (HOSPITAL_BASED_OUTPATIENT_CLINIC_OR_DEPARTMENT_OTHER): Payer: Self-pay

## 2023-09-05 ENCOUNTER — Other Ambulatory Visit: Payer: Self-pay

## 2023-09-05 ENCOUNTER — Encounter (HOSPITAL_BASED_OUTPATIENT_CLINIC_OR_DEPARTMENT_OTHER): Payer: Self-pay | Admitting: Emergency Medicine

## 2023-09-05 ENCOUNTER — Emergency Department (HOSPITAL_BASED_OUTPATIENT_CLINIC_OR_DEPARTMENT_OTHER)
Admission: EM | Admit: 2023-09-05 | Discharge: 2023-09-05 | Disposition: A | Discharge status: Home / Self Care | Attending: Emergency Medicine | Admitting: Emergency Medicine

## 2023-09-05 DIAGNOSIS — R0981 Nasal congestion: Secondary | ICD-10-CM | POA: Insufficient documentation

## 2023-09-05 DIAGNOSIS — H9203 Otalgia, bilateral: Secondary | ICD-10-CM | POA: Insufficient documentation

## 2023-09-05 DIAGNOSIS — R509 Fever, unspecified: Secondary | ICD-10-CM | POA: Diagnosis present

## 2023-09-05 DIAGNOSIS — J029 Acute pharyngitis, unspecified: Secondary | ICD-10-CM | POA: Diagnosis not present

## 2023-09-05 MED ORDER — CETIRIZINE HCL 10 MG PO TABS
10.0000 mg | ORAL_TABLET | Freq: Every day | ORAL | 0 refills | Status: AC
  Filled 2023-09-05: qty 30, 30d supply, fill #0

## 2023-09-05 NOTE — ED Triage Notes (Signed)
 Pt via pov from home with fever, bilateral ear pain, back pain with certain movements (thoracic); denies urinary symptoms. She does endorse hematuria, but states she has this all the time and her GYN doc knows about this. Pt is 7 weeks post op from micro vascular decompression surgery; providers don't think this is related. Pt has had covid/flu swabs (negative) - yesterday was last. Was seen at [facility] yesterday, told she probably had a virus.

## 2023-09-05 NOTE — Discharge Instructions (Addendum)
 Prescription for allergy pill sent to the pharmacy.  No signs of otitis media or otitis externa on exam.  Some evidence of nasal congestion and postnasal drip present.  Symptoms thought to be likely secondary to a viral URI versus allergies.  Please call your surgical specialist to be continued to have fevers.

## 2023-09-05 NOTE — ED Notes (Signed)
 DC instructions reviewed with pt by provider. This RN enters room to DC with paperwork. Pt is not in the room. Pt has left.

## 2023-09-05 NOTE — ED Provider Notes (Signed)
 [place] EMERGENCY DEPARTMENT AT [facility] Provider Note   CSN: [id number] Arrival date & time: 09/05/23  7829     History  Chief Complaint  Patient presents with   Fever   Otalgia    Gabriela Johnson is a 48 y.o. female.  Patient is a 47 year old female presenting for fevers.  Patient states for the last several days she has had a fever with a Tmax of 103 F.  She admits to minimal nasal congestion, sore throat, and bilateral ear pain.  She denies any coughing.  Denies nausea, vomiting, abdominal pain, diarrhea.  On July 17, 2023 patient had a left retrosigmoid craniotomy for microvascular decompression of the facial nerve with Dr.Zomorodi, Franklyn Lor, MD. She denies any swelling, warmth, erythema, pain, or drainage at previous incision site.  The history is provided by the patient. No language interpreter was used.  Fever Associated symptoms: congestion, ear pain and sore throat   Associated symptoms: no chest pain, no chills, no cough, no dysuria, no rash and no vomiting   Otalgia Associated symptoms: congestion, fever and sore throat   Associated symptoms: no abdominal pain, no cough, no rash and no vomiting        Home Medications Prior to Admission medications   Medication Sig Start Date End Date Taking? Authorizing Provider  cetirizine (ZYRTEC ALLERGY) 10 MG tablet Take 1 tablet (10 mg total) by mouth daily. 09/05/23  Yes Edwin Dada P, DO  Cholecalciferol (VITAMIN D3 PO) Take 1,000 Units by mouth daily.    [provider]  meloxicam (MOBIC) 15 MG tablet Take 1 tablet (15 mg total) by mouth daily. 07/11/22   Lenn Sink, DPM  Multiple Vitamin (MULTIVITAMIN) tablet Take 1 tablet by mouth daily.    [provider]  XEOMIN 50 units SOLR injection Inject 50 Units into the muscle every 3 (three) months. 08/12/20   [provider]      Allergies    Patient has no known allergies.    Review of Systems   Review of Systems   Constitutional:  Positive for fever. Negative for chills.  HENT:  Positive for congestion, ear pain and sore throat.   Eyes:  Negative for pain and visual disturbance.  Respiratory:  Negative for cough and shortness of breath.   Cardiovascular:  Negative for chest pain and palpitations.  Gastrointestinal:  Negative for abdominal pain and vomiting.  Genitourinary:  Negative for dysuria and hematuria.  Musculoskeletal:  Negative for arthralgias and back pain.  Skin:  Negative for color change and rash.  Neurological:  Negative for seizures and syncope.  All other systems reviewed and are negative.   Physical Exam Updated Vital Signs BP (!) 138/90 (BP Location: Right Arm)   Pulse 93   Temp 99.1 F (37.3 C)   Resp 18   Ht 5\' 3"  (1.6 m)   Wt 63.5 kg   LMP 06/19/2015   SpO2 100%   BMI 24.80 kg/m  Physical Exam Vitals and nursing note reviewed.  Constitutional:      General: She is not in acute distress.    Appearance: She is well-developed.  HENT:     Head: Normocephalic and atraumatic.      Right Ear: Hearing, tympanic membrane, ear canal and external ear normal.     Left Ear: Hearing, tympanic membrane, ear canal and external ear normal.     Nose: Congestion present.  Eyes:     Conjunctiva/sclera: Conjunctivae normal.  Cardiovascular:  Rate and Rhythm: Normal rate and regular rhythm.     Heart sounds: No murmur heard. Pulmonary:     Effort: Pulmonary effort is normal. No respiratory distress.     Breath sounds: Normal breath sounds.  Abdominal:     Palpations: Abdomen is soft.     Tenderness: There is no abdominal tenderness.  Musculoskeletal:        General: No swelling.     Cervical back: Neck supple.  Skin:    General: Skin is warm and dry.     Capillary Refill: Capillary refill takes less than 2 seconds.  Neurological:     Mental Status: She is alert.  Psychiatric:        Mood and Affect: Mood normal.     ED Results / Procedures / Treatments    Labs (all labs ordered are listed, but only abnormal results are displayed) Labs Reviewed - No data to display  EKG None  Radiology No results found.  Procedures Procedures    Medications Ordered in ED Medications - No data to display  ED Course/ Medical Decision Making/ A&P                                 Medical Decision Making Risk OTC drugs.   74:50 AM  48 year old female presenting for fevers and bilateral ear pain.  Patient is alert and oriented x 3, no acute distress, afebrile, stable vital signs.  On exam there is no neurovascular deficits.  No otitis media.  Some nasal congestion only.  Laboratory studies unimpressive.  No signs or symptoms of sepsis.  Patient is afebrile at this time.  No signs or symptoms of meningitis.  Patient recommended for therapeutic measures for likely viral syndrome.  Patient in no distress and overall condition improved here in the ED. Detailed discussions were had with the patient regarding current findings, and need for close f/u with PCP or on call doctor. The patient has been instructed to return immediately if the symptoms worsen in any way for re-evaluation. Patient verbalized understanding and is in agreement with current care plan. All questions answered prior to discharge.         Final Clinical Impression(s) / ED Diagnoses Final diagnoses:  Fever, unspecified fever cause  Acute ear pain, bilateral    Rx / DC Orders ED Discharge Orders          Ordered    cetirizine (ZYRTEC ALLERGY) 10 MG tablet  Daily        09/05/23 1337              Franne Forts, DO 09/19/23 0008

## 2023-09-05 NOTE — ED Notes (Signed)
 [facility]; all was wnl, except pt was told her RBC was low. Covid/flu negative.

## 2023-09-06 ENCOUNTER — Emergency Department (HOSPITAL_BASED_OUTPATIENT_CLINIC_OR_DEPARTMENT_OTHER)
Admission: EM | Admit: 2023-09-06 | Discharge: 2023-09-06 | Disposition: A | Discharge status: Home / Self Care | Attending: Emergency Medicine | Admitting: Emergency Medicine

## 2023-09-06 ENCOUNTER — Emergency Department (HOSPITAL_BASED_OUTPATIENT_CLINIC_OR_DEPARTMENT_OTHER)

## 2023-09-06 ENCOUNTER — Other Ambulatory Visit: Payer: Self-pay

## 2023-09-06 DIAGNOSIS — R509 Fever, unspecified: Secondary | ICD-10-CM | POA: Insufficient documentation

## 2023-09-06 DIAGNOSIS — J029 Acute pharyngitis, unspecified: Secondary | ICD-10-CM | POA: Insufficient documentation

## 2023-09-06 DIAGNOSIS — R519 Headache, unspecified: Secondary | ICD-10-CM | POA: Insufficient documentation

## 2023-09-06 DIAGNOSIS — R0981 Nasal congestion: Secondary | ICD-10-CM | POA: Diagnosis not present

## 2023-09-06 DIAGNOSIS — H9203 Otalgia, bilateral: Secondary | ICD-10-CM | POA: Insufficient documentation

## 2023-09-06 LAB — BASIC METABOLIC PANEL WITH GFR
Anion gap: 8 (ref 5–15)
BUN: 15 mg/dL (ref 6–20)
CO2: 28 mmol/L (ref 22–32)
Calcium: 8.8 mg/dL — ABNORMAL LOW (ref 8.9–10.3)
Chloride: 104 mmol/L (ref 98–111)
Creatinine, Ser: 0.7 mg/dL (ref 0.44–1.00)
GFR, Estimated: >60 mL/min (ref >60)
Glucose, Bld: 114 mg/dL — ABNORMAL HIGH (ref 70–99)
Potassium: 3.8 mmol/L (ref 3.5–5.1)
Sodium: 140 mmol/L (ref 135–145)

## 2023-09-06 LAB — CBC WITH DIFFERENTIAL/PLATELET
Abs Immature Granulocytes: 0.01 10*3/uL (ref 0.00–0.07)
Basophils Absolute: 0 10*3/uL (ref 0.0–0.1)
Basophils Relative: 0 %
Eosinophils Absolute: 0.1 10*3/uL (ref 0.0–0.5)
Eosinophils Relative: 3 %
HCT: 40.3 % (ref 36.0–46.0)
Hemoglobin: 13.8 g/dL (ref 12.0–15.0)
Immature Granulocytes: 0 %
Lymphocytes Relative: 24 %
Lymphs Abs: 0.9 10*3/uL (ref 0.7–4.0)
MCH: 33.6 pg (ref 26.0–34.0)
MCHC: 34.2 g/dL (ref 30.0–36.0)
MCV: 98.1 fL (ref 80.0–100.0)
Monocytes Absolute: 0.4 10*3/uL (ref 0.1–1.0)
Monocytes Relative: 12 %
Neutro Abs: 2.2 10*3/uL (ref 1.7–7.7)
Neutrophils Relative %: 61 %
Platelets: 214 10*3/uL (ref 150–400)
RBC: 4.11 MIL/uL (ref 3.87–5.11)
RDW: 11.9 % (ref 11.5–15.5)
WBC: 3.6 10*3/uL — ABNORMAL LOW (ref 4.0–10.5)
nRBC: 0 % (ref 0.0–0.2)

## 2023-09-06 LAB — LACTIC ACID, PLASMA: Lactic Acid, Venous: 0.8 mmol/L (ref 0.5–1.9)

## 2023-09-06 MED ORDER — IOHEXOL 300 MG/ML  SOLN
75.0000 mL | Freq: Once | INTRAMUSCULAR | Status: AC | PRN
  Administered 2023-09-06: 75 mL via INTRAVENOUS

## 2023-09-06 MED ORDER — ACETAMINOPHEN 325 MG PO TABS
650.0000 mg | ORAL_TABLET | Freq: Once | ORAL | Status: AC
  Administered 2023-09-06: 650 mg via ORAL
  Filled 2023-09-06: qty 2

## 2023-09-06 NOTE — ED Triage Notes (Signed)
 Pt POV reporting persistent fever past few days, seen yesterday and dx viral illness, 7 weeks post op micro vascular decompression surgery, spoke to neurologist today and was told to return for CT, pt previously declined yesterday. NAD noted at this time.

## 2023-09-06 NOTE — ED Provider Notes (Signed)
 [place] EMERGENCY DEPARTMENT AT [facility] Provider Note   CSN: [id number] Arrival date & time: 09/06/23  1512     History  Chief Complaint  Patient presents with   Fever    Amandajo L Radich is a 48 y.o. female.   Fever Associated symptoms: ear pain   Patient is a 48 year old female complaining of fever, headache, bilateral ear pain and sore throat.  Previously underwent a left retrosigmoid craniotomy with microvascular decompression of the facial nerve with Dr. Arlyss Queen, Dr. Ignacia Felling.  Was in the ED yesterday and had labs done, left after discharge instructions were discussed with the provider but was not given discharge paperwork as she had left already, provided cetirizine which she has not picked up yet.  Reports last fever being this morning with a Tmax of 101.8F.  However has been taking Tylenol and ibuprofen.  Talked to her neurosurgeon who said that she should come in for a CT scan today.  Currently asymptomatic saying symptoms only present with fever.  Denies vision changes, vertigo, neck pain, hearing loss, discharge from ear, chest pain, shortness of breath, abdominal pain, vomiting, diarrhea, dysuria, lower leg swelling.    Home Medications Prior to Admission medications   Medication Sig Start Date End Date Taking? Authorizing Provider  cetirizine (ZYRTEC ALLERGY) 10 MG tablet Take 1 tablet (10 mg total) by mouth daily. 09/05/23   Franne Forts, DO  Cholecalciferol (VITAMIN D3 PO) Take 1,000 Units by mouth daily.    [provider]  meloxicam (MOBIC) 15 MG tablet Take 1 tablet (15 mg total) by mouth daily. 07/11/22   Lenn Sink, DPM  Multiple Vitamin (MULTIVITAMIN) tablet Take 1 tablet by mouth daily.    [provider]  XEOMIN 50 units SOLR injection Inject 50 Units into the muscle every 3 (three) months. 08/12/20   [provider]      Allergies    Patient has no known allergies.    Review of Systems   Review of Systems   Constitutional:  Positive for fever.  HENT:  Positive for ear pain.   All other systems reviewed and are negative.   Physical Exam Updated Vital Signs BP 137/84   Pulse 88   Temp 98.2 F (36.8 C) (Oral)   Resp 18   Ht 5\' 3"  (1.6 m)   Wt 63.5 kg   LMP 06/19/2015   SpO2 95%   BMI 24.80 kg/m  Physical Exam Vitals and nursing note reviewed.  Constitutional:      General: She is not in acute distress.    Appearance: Normal appearance. She is not ill-appearing.  HENT:     Head: Normocephalic and atraumatic.     Right Ear: Ear canal and external ear normal. There is no impacted cerumen.     Left Ear: Ear canal and external ear normal. There is no impacted cerumen.     Ears:     Comments: Air-fluid levels noted in both left and right ears.  Mild scarring noted to right TM.    Nose: Congestion present. No rhinorrhea.     Mouth/Throat:     Mouth: Mucous membranes are moist.     Pharynx: Oropharynx is clear. No oropharyngeal exudate or posterior oropharyngeal erythema.  Eyes:     General: No scleral icterus.       Right eye: No discharge.        Left eye: No discharge.     Extraocular Movements: Extraocular movements intact.  Conjunctiva/sclera: Conjunctivae normal.     Pupils: Pupils are equal, round, and reactive to light.  Cardiovascular:     Rate and Rhythm: Normal rate and regular rhythm.     Pulses: Normal pulses.     Heart sounds: Normal heart sounds. No murmur heard.    No friction rub. No gallop.  Pulmonary:     Effort: Pulmonary effort is normal. No respiratory distress.     Breath sounds: Normal breath sounds. No stridor. No wheezing, rhonchi or rales.  Abdominal:     General: Abdomen is flat. There is no distension.     Palpations: Abdomen is soft.     Tenderness: There is no abdominal tenderness. There is no right CVA tenderness, left CVA tenderness or guarding.  Musculoskeletal:        General: No tenderness, deformity or signs of injury.     Cervical  back: Normal range of motion and neck supple. No rigidity or tenderness.     Right lower leg: No edema.     Left lower leg: No edema.  Skin:    General: Skin is warm and dry.     Coloration: Skin is not pale.     Findings: No bruising or erythema.  Neurological:     General: No focal deficit present.     Mental Status: She is alert and oriented to person, place, and time. Mental status is at baseline.     Sensory: No sensory deficit.     Motor: No weakness.     Coordination: Coordination normal.     Gait: Gait normal.     Comments: Patient noted to have mild left facial spasming which she said is been intermittent and normal since surgery.  Psychiatric:        Mood and Affect: Mood normal.     ED Results / Procedures / Treatments   Labs (all labs ordered are listed, but only abnormal results are displayed) Labs Reviewed  CBC WITH DIFFERENTIAL/PLATELET - Abnormal; Notable for the following components:      Result Value   WBC 3.6 (*)    All other components within normal limits  BASIC METABOLIC PANEL WITH GFR - Abnormal; Notable for the following components:   Glucose, Bld 114 (*)    Calcium 8.8 (*)    All other components within normal limits  CULTURE, BLOOD (ROUTINE X 2)  CULTURE, BLOOD (ROUTINE X 2)  LACTIC ACID, PLASMA    EKG None  Radiology CT Head W or Wo Contrast Result Date: 09/06/2023 CLINICAL DATA:  Headaches and fever for a few days. History of left-sided microvascular decompression or hemifacial spasm on 07/17/2023. EXAM: CT HEAD WITHOUT AND WITH CONTRAST TECHNIQUE: Contiguous axial images were obtained from the base of the skull through the vertex without and with intravenous contrast. RADIATION DOSE REDUCTION: This exam was performed according to the departmental dose-optimization program which includes automated exposure control, adjustment of the mA and/or kV according to patient size and/or use of iterative reconstruction technique. CONTRAST:  75mL OMNIPAQUE  IOHEXOL 300 MG/ML  SOLN COMPARISON:  Head MRI 08/15/2020 FINDINGS: Brain: Sequelae of interval left retrosigmoid craniotomy are identified. A new small low-density subdural fluid collection overlying the lateral and superior aspect of the left cerebellar hemisphere measures up to 6 mm in thickness. There is no significant associated mass effect, brain edema, or significant enhancement of the collection. No acute intracranial hemorrhage, acute infarct, or midline shift is evident. Cerebral volume is normal. The ventricles are normal in  size. Vascular: The dural venous sinuses are grossly patent. Skull: Left retrosigmoid craniotomy. Sinuses/Orbits: The paranasal sinuses and mastoid air cells are well aerated. Unremarkable orbits. Other: None. IMPRESSION: Interval left retrosigmoid craniotomy. 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 No significant mass effect or findings specifically suggestive of infection/empyema, although if this were a clinical concern MRI would provide better assessment. Electronically Signed   By: Sebastian Ache M.D.   On: 09/06/2023 17:57    Procedures Procedures    Medications Ordered in ED Medications  iohexol (OMNIPAQUE) 300 MG/ML solution 75 mL (75 mLs Intravenous Contrast Given 09/06/23 1639)  acetaminophen (TYLENOL) tablet 650 mg (650 mg Oral Given 09/06/23 1844)    ED Course/ Medical Decision Making/ A&P Clinical Course as of 09/06/23 2016  Fri Sep 06, 2023  1954 Eosinophils Absolute: 0.1 [CB]    Clinical Course User Index [CB] Lunette Stands, PA-C                                 Medical Decision Making Amount and/or Complexity of Data Reviewed Labs: ordered. Radiology: ordered.  Risk OTC drugs. Prescription drug management.   This patient is a 48 year old female who presents to the ED for concern of persistent fever x 7 days, seen yesterday and diagnosed with  a URI but is 7 weeks postop from a microvascular surgical decompression and was told to come into the ED today by neurologist for CT as patient previously had declined yesterday.  On physical exam, patient is noted to be afebrile, no acute stress, speaking full sentences, alert oriented x 4.  Noted to have some left-sided facial twitching which is chronic.  Patient noted to have some scarring to the right TM with air-fluid levels noted bilaterally.  Notable congestion bilaterally with some left-sided turbinate.  Swelling neuroexam is otherwise unremarkable.  Abdomen is nontender, no CVA tenderness.  LCTAB.  Unremarkable physical exam otherwise.  Lab work was unremarkable and CT showed a hygroma with no signs of infection.  With patient's normal labs, blood cultures were drawn due to fever of unknown origin.  However with patient's fever currently controlled with ibuprofen and Tylenol with notable URI-like symptoms, suspect that this is likely a URI.  However will recommend that she follow-up with PCP if fever is still present.  Provided strict return to ED precautions.  Patient is expressing her desire to go home right now would not have any further evaluation at this time.  I believe patient said to be discharged at this time.  All questions were answered.  Patient expressed agreement understanding of plan.  Differential diagnoses prior to evaluation: The emergent differential diagnosis includes, but is not limited to, surgical complication, abscess, sepsis, pneumonia, UTI, URI. This is not an exhaustive differential.   Past Medical History / Co-morbidities / Social History: Facial twitching with left retrosigmoid craniotomy with microvascular decompression of the facial nerve 2025  Additional history: Chart reviewed. Pertinent results include:  Seen yesterday and on 09/04/23 for same symptoms.  Noted to have congestion and was prescribed Zyrtec.  Also noted to have left-sided facial spasms at the  time.  Was recently admitted on 07/17/2023 for sinus arrhythmia at [facility].  Lab Tests/Imaging studies: I personally interpreted labs/imaging and the pertinent results include: CBC notes a low white count of 3.6 but is otherwise unremarkable BMP notes a mild hypocalcemia  of 8.8 but otherwise unremarkable Lactic acid unremarkable.   CT head with and without contrast shows a new small low-density subdural fluid collection overlying the left cerebral hemisphere favored to be a hygroma with no significant suggestions of infection or empyema I agree with the radiologist interpretation.  Medications: I ordered medication including Tylenol.  I have reviewed the patients home medicines and have made adjustments as needed.  Social Determinants of Health:  Disposition: After consideration of the diagnostic results and the patients response to treatment, I feel that the patient would benefit from the patient bit from discharge and treatment as above.   emergency department workup does not suggest an emergent condition requiring admission or immediate intervention beyond what has been performed at this time. The plan is: Follow-up with PCP, return for any new or worsening symptoms. The patient is safe for discharge and has been instructed to return immediately for worsening symptoms, change in symptoms or any other concerns.  Final Clinical Impression(s) / ED Diagnoses Final diagnoses:  Fever, unspecified fever cause    Rx / DC Orders ED Discharge Orders     None         Lavonia Drafts 09/06/23 2017    Linwood Dibbles, MD 09/09/23 [phone]

## 2023-09-06 NOTE — Discharge Instructions (Addendum)
 You were seen today for a any fever.  The CT and labs today were reassuring that I have low suspicion for any emergent process present at this time.  Your CT did note a hygroma which is a fluid-filled sac in the area.  But was not concerning for infection.  Continue to take the cetirizine as previously prescribed to see if this also helps with symptoms.   Recommend that you continue to monitor symptoms and if present for longer than a couple of days follow-up with PCP for further evaluation.    Return to ED for any new or worsening symptoms including vision changes with headache, nausea, vomiting, diarrhea, chest pain, shortness of breath, abdominal pain, painful urination, lower leg swelling, unmanageable pain despite Tylenol and Profen.

## 2023-09-11 ENCOUNTER — Other Ambulatory Visit: Payer: Self-pay

## 2023-09-11 ENCOUNTER — Emergency Department (HOSPITAL_BASED_OUTPATIENT_CLINIC_OR_DEPARTMENT_OTHER)
Admission: EM | Admit: 2023-09-11 | Discharge: 2023-09-11 | Disposition: A | Discharge status: Home / Self Care | Attending: Emergency Medicine | Admitting: Emergency Medicine

## 2023-09-11 ENCOUNTER — Other Ambulatory Visit (HOSPITAL_BASED_OUTPATIENT_CLINIC_OR_DEPARTMENT_OTHER): Payer: Self-pay

## 2023-09-11 ENCOUNTER — Emergency Department (HOSPITAL_BASED_OUTPATIENT_CLINIC_OR_DEPARTMENT_OTHER): Admitting: Radiology

## 2023-09-11 DIAGNOSIS — R509 Fever, unspecified: Secondary | ICD-10-CM | POA: Insufficient documentation

## 2023-09-11 DIAGNOSIS — R519 Headache, unspecified: Secondary | ICD-10-CM | POA: Insufficient documentation

## 2023-09-11 LAB — COMPREHENSIVE METABOLIC PANEL WITH GFR
ALT: 14 U/L (ref 0–44)
AST: 13 U/L — ABNORMAL LOW (ref 15–41)
Albumin: 4.5 g/dL (ref 3.5–5.0)
Alkaline Phosphatase: 35 U/L — ABNORMAL LOW (ref 38–126)
Anion gap: 12 (ref 5–15)
BUN: 20 mg/dL (ref 6–20)
CO2: 26 mmol/L (ref 22–32)
Calcium: 9.3 mg/dL (ref 8.9–10.3)
Chloride: 102 mmol/L (ref 98–111)
Creatinine, Ser: 0.58 mg/dL (ref 0.44–1.00)
GFR, Estimated: >60 mL/min (ref >60)
Glucose, Bld: 115 mg/dL — ABNORMAL HIGH (ref 70–99)
Potassium: 3.5 mmol/L (ref 3.5–5.1)
Sodium: 140 mmol/L (ref 135–145)
Total Bilirubin: 0.4 mg/dL (ref 0.0–1.2)
Total Protein: 7.2 g/dL (ref 6.5–8.1)

## 2023-09-11 LAB — CBC WITH DIFFERENTIAL/PLATELET
Abs Immature Granulocytes: 0.01 10*3/uL (ref 0.00–0.07)
Basophils Absolute: 0 10*3/uL (ref 0.0–0.1)
Basophils Relative: 0 %
Eosinophils Absolute: 0.1 10*3/uL (ref 0.0–0.5)
Eosinophils Relative: 2 %
HCT: 43.4 % (ref 36.0–46.0)
Hemoglobin: 15.1 g/dL — ABNORMAL HIGH (ref 12.0–15.0)
Immature Granulocytes: 0 %
Lymphocytes Relative: 21 %
Lymphs Abs: 1 10*3/uL (ref 0.7–4.0)
MCH: 33.6 pg (ref 26.0–34.0)
MCHC: 34.8 g/dL (ref 30.0–36.0)
MCV: 96.7 fL (ref 80.0–100.0)
Monocytes Absolute: 0.4 10*3/uL (ref 0.1–1.0)
Monocytes Relative: 9 %
Neutro Abs: 3.3 10*3/uL (ref 1.7–7.7)
Neutrophils Relative %: 68 %
Platelets: 247 10*3/uL (ref 150–400)
RBC: 4.49 MIL/uL (ref 3.87–5.11)
RDW: 11.9 % (ref 11.5–15.5)
WBC: 4.8 10*3/uL (ref 4.0–10.5)
nRBC: 0 % (ref 0.0–0.2)

## 2023-09-11 LAB — URINALYSIS, ROUTINE W REFLEX MICROSCOPIC
Bilirubin Urine: NEGATIVE
Glucose, UA: NEGATIVE mg/dL
Hgb urine dipstick: NEGATIVE
Ketones, ur: 40 mg/dL — AB
Leukocytes,Ua: NEGATIVE
Nitrite: NEGATIVE
Specific Gravity, Urine: 1.025 (ref 1.005–1.030)
pH: 5.5 (ref 5.0–8.0)

## 2023-09-11 LAB — RESP PANEL BY RT-PCR (RSV, FLU A&B, COVID)  RVPGX2
Influenza A by PCR: NEGATIVE
Influenza B by PCR: NEGATIVE
Resp Syncytial Virus by PCR: NEGATIVE
SARS Coronavirus 2 by RT PCR: NEGATIVE

## 2023-09-11 LAB — LACTIC ACID, PLASMA: Lactic Acid, Venous: 0.8 mmol/L (ref 0.5–1.9)

## 2023-09-11 LAB — CULTURE, BLOOD (ROUTINE X 2)
Culture: NO GROWTH
Culture: NO GROWTH
Special Requests: ADEQUATE
Special Requests: ADEQUATE

## 2023-09-11 LAB — GROUP A STREP BY PCR: Group A Strep by PCR: NOT DETECTED

## 2023-09-11 MED ORDER — METHYLPREDNISOLONE 4 MG PO TBPK
ORAL_TABLET | ORAL | 0 refills | Status: AC
Start: 2023-09-11 — End: 2023-09-17
  Filled 2023-09-11: qty 21, 6d supply, fill #0

## 2023-09-11 NOTE — Discharge Instructions (Signed)
 You were seen in the emergency department today for concerns of fever and headaches.  Your workup was thankfully reassuring.  I spoke with Dr.Zomorodi with [facility] neurosurgery and reviewed her case with him.  He advised starting you on a Medrol Dosepak for the next week and following up with his clinic for repeat assessment.  He is doubtful of meningitis that did not recommend a lumbar puncture at this time but did suspect that there is a possibility of a condition called chemical meningitis.  This is the reason for starting the steroids.  For any concerns of new or worsening symptoms, return to the emergency department.  Otherwise, please follow-up with the neurosurgery clinic.

## 2023-09-11 NOTE — ED Notes (Signed)
 Swab is done and sent to the lab.

## 2023-09-11 NOTE — ED Provider Notes (Signed)
 [place] EMERGENCY DEPARTMENT AT [facility] Provider Note   CSN: [id number] Arrival date & time: 09/11/23  1029     History Chief Complaint  Patient presents with   Fever    Gabriela Johnson is a 48 y.o. female.  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  Reports that this has been ongoing since 21 March.  She had a left retrosigmoid craniotomy with microvascular decompression of the facial nerve with Dr. Arlyss Queen.  She has been seen numerous times for the similar concern in the last 6 weeks between different urgent care clinics, in our emergency department.  Most recently seen in the emergency department on 09/06/2023 with a negative workup.  She reports that the headache and fevers responded well with Tylenol and ibuprofen but unable to keep symptoms fully suppressed.  She denies any sick contacts recently.  Up-to-date on all immunizations.   Fever      Home Medications Prior to Admission medications   Medication Sig Start Date End Date Taking? Authorizing Provider  methylPREDNISolone (MEDROL DOSEPAK) 4 MG TBPK tablet Take 6 tablets (24 mg total) by mouth daily for 1 day, THEN 5 tablets (20 mg total) daily for 1 day, THEN 4 tablets (16 mg total) daily for 1 day, THEN 3 tablets (12 mg total) daily for 1 day, THEN 2 tablets (8 mg total) daily for 1 day, THEN 1 tablet (4 mg total) daily for 1 day. 09/11/23 09/17/23 Yes Smitty Knudsen, PA-C  cetirizine (ZYRTEC ALLERGY) 10 MG tablet Take 1 tablet (10 mg total) by mouth daily. 09/05/23   Franne Forts, DO  Cholecalciferol (VITAMIN D3 PO) Take 1,000 Units by mouth daily.    [provider]  meloxicam (MOBIC) 15 MG tablet Take 1 tablet (15 mg total) by mouth daily. 07/11/22   Lenn Sink, DPM  Multiple Vitamin (MULTIVITAMIN) tablet Take 1 tablet by mouth daily.    [provider]   XEOMIN 50 units SOLR injection Inject 50 Units into the muscle every 3 (three) months. 08/12/20   [provider]      Allergies    Patient has no known allergies.    Review of Systems   Review of Systems  Constitutional:  Positive for fever.  All other systems reviewed and are negative.   Physical Exam Updated Vital Signs BP (!) 133/98   Pulse 95   Temp 98.6 F (37 C) (Oral)   Resp 15   LMP 06/19/2015   SpO2 99%  Physical Exam Vitals and nursing note reviewed.  Constitutional:      General: She is not in acute distress.    Appearance: She is well-developed.  HENT:     Head: Normocephalic and atraumatic.  Eyes:     Conjunctiva/sclera: Conjunctivae normal.  Cardiovascular:     Rate and Rhythm: Normal rate and regular rhythm.     Heart sounds: No murmur heard. Pulmonary:     Effort: Pulmonary effort is normal. No respiratory distress.     Breath sounds: Normal breath sounds.  Abdominal:     Palpations: Abdomen is soft.     Tenderness: There is no abdominal tenderness.  Musculoskeletal:        General: No swelling.     Cervical back: Neck supple.  Skin:    General: Skin is warm and dry.     Capillary Refill: Capillary  refill takes less than 2 seconds.  Neurological:     General: No focal deficit present.     Mental Status: She is alert and oriented to person, place, and time. Mental status is at baseline.     Comments: Negative Kernig's, Brudzinski's, and jolt exams.  Psychiatric:        Mood and Affect: Mood normal.     ED Results / Procedures / Treatments   Labs (all labs ordered are listed, but only abnormal results are displayed) Labs Reviewed  CBC WITH DIFFERENTIAL/PLATELET - Abnormal; Notable for the following components:      Result Value   Hemoglobin 15.1 (*)    All other components within normal limits  COMPREHENSIVE METABOLIC PANEL WITH GFR - Abnormal; Notable for the following components:   Glucose, Bld 115 (*)    AST 13 (*)     Alkaline Phosphatase 35 (*)    All other components within normal limits  URINALYSIS, ROUTINE W REFLEX MICROSCOPIC - Abnormal; Notable for the following components:   Ketones, ur 40 (*)    Protein, ur TRACE (*)    All other components within normal limits  GROUP A STREP BY PCR  RESP PANEL BY RT-PCR (RSV, FLU A&B, COVID)  [id number]  LACTIC ACID, PLASMA    EKG None  Radiology DG Chest 2 View Result Date: 09/11/2023 CLINICAL DATA:  Fever and body aches for 2 weeks. EXAM: CHEST - 2 VIEW COMPARISON:  06/13/2011 FINDINGS: The heart size and mediastinal contours are within normal limits. Both lungs are clear. The visualized skeletal structures are unremarkable. IMPRESSION: No active cardiopulmonary disease. Electronically Signed   By: Danae Orleans M.D.   On: 09/11/2023 12:43    Procedures Procedures    Medications Ordered in ED Medications - No data to display  ED Course/ Medical Decision Making/ A&P                                 Medical Decision Making Amount and/or Complexity of Data Reviewed Labs: ordered. Radiology: ordered.  Risk Prescription drug management.   This patient presents to the ED for concern of fever.  Differential diagnosis includes sinusitis, meningitis, viral URI, pneumonia, UTI   Lab Tests:  I Ordered, and personally interpreted labs.  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  Patient reportedly had a left retrosigmoid craniotomy by Dr. Arlyss Queen.  She has reportedly  had evaluation at [facility] emergency departments as well as [facility] emergency departments multiple times and last several weeks with no specific findings seen to account for her current symptoms.  She is reporting that she was advised by her neurosurgery group to come to the emergency department for possible lumbar puncture for assessment of recurrent headaches. Physical exam is reassuring.  No meningeal findings at this time.  Negative Kernig's, Brudzinski's, jolt test.  She does have some cervical tenderness with flexion of the neck.  At this time, she is still endorsing headaches.  Obtain labs for possible  assessment of current migraine.  Given patient had CT imaging 5 days ago, no acute indication for repeat imaging. Lab workup unremarkable.  No evidence of leukocytosis.  Lactic acid normal at 0.8.  Negative for strep, respiratory panel was also negative, CMP unremarkable, UA without evidence of infection.  Chest x-ray unremarkable. Given unclear etiology, I consulted with [facility] neurosurgery, Dr. Arlyss Queen, who advised that lumbar puncture likely would not be beneficial at this time given patient has no meningeal findings.  Instead, he did express some concerns for possible chemical meningitis which is an entirely different process than infectious meningitis.  Advise use of a Medrol Dosepak for the next week with close follow-up with the [facility] neurosurgery group. I shared these recommendation with the patient and she verbalized understanding this plan and is in current agreement.  Return precautions discussed.  Patient otherwise stable for outpatient follow-up and discharged home.  Final Clinical Impression(s) / ED Diagnoses Final diagnoses:  Frequent headaches  Fever, unspecified fever cause    Rx / DC Orders ED Discharge Orders          Ordered    methylPREDNISolone (MEDROL DOSEPAK) 4 MG TBPK tablet  Daily        09/11/23 1410              Smitty Knudsen, PA-C 09/11/23  1501    Rexford Maus, DO 09/12/23 [phone]

## 2023-09-11 NOTE — ED Triage Notes (Addendum)
 Pt has had fever and body aches since about the 21st of march. Pt had microvascular decompression on 07/17/2023. Pt was seen here on 3/27 and 3/28 and discharged from the ED.  Pt continues to have temperature up to 103.45F when not taking medications but she has mostly tried to take ibuprofen and tylenol around the clock.  Pt continues to have a headache and bilateral ear ache as well as nausea and lack of appetite.  Pt states that she has been feeling generally weak.  Pt is alert and oriented. Neck supple

## 2023-09-11 NOTE — ED Notes (Signed)

## 2023-09-11 NOTE — ED Notes (Signed)
 Pt took 600mg  ibuprofen at 7am today and took 100mg  tylenol at 3am.  Temp 98.6 at this time
